# Patient Record
Sex: Male | Born: 1960 | Race: White | Hispanic: No | Marital: Married | Smoking: Former smoker
Health system: Southern US, Community
[De-identification: ages and names within clinical notes are randomized; demographics above are authoritative.]

## PROBLEM LIST (undated history)

## (undated) DIAGNOSIS — C801 Malignant (primary) neoplasm, unspecified: Secondary | ICD-10-CM

## (undated) DIAGNOSIS — I10 Essential (primary) hypertension: Secondary | ICD-10-CM

## (undated) DIAGNOSIS — I499 Cardiac arrhythmia, unspecified: Secondary | ICD-10-CM

## (undated) DIAGNOSIS — Z923 Personal history of irradiation: Secondary | ICD-10-CM

## (undated) HISTORY — DX: Personal history of irradiation: Z92.3

## (undated) HISTORY — DX: Essential (primary) hypertension: I10

---
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
## 2010-01-26 HISTORY — PX: SHOULDER SURGERY: SHX246

## 2011-05-30 DIAGNOSIS — C801 Malignant (primary) neoplasm, unspecified: Secondary | ICD-10-CM

## 2011-05-30 HISTORY — DX: Malignant (primary) neoplasm, unspecified: C80.1

## 2011-06-02 ENCOUNTER — Encounter (HOSPITAL_BASED_OUTPATIENT_CLINIC_OR_DEPARTMENT_OTHER): Payer: 59 | Admitting: Oncology

## 2011-06-02 ENCOUNTER — Ambulatory Visit (HOSPITAL_COMMUNITY)
Admission: RE | Admit: 2011-06-02 | Discharge: 2011-06-02 | Disposition: A | Discharge status: Home / Self Care | Payer: 59 | Source: Ambulatory Visit | Attending: Gastroenterology | Admitting: Gastroenterology

## 2011-06-02 DIAGNOSIS — C20 Malignant neoplasm of rectum: Secondary | ICD-10-CM

## 2011-06-08 ENCOUNTER — Ambulatory Visit
Admit: 2011-06-08 | Discharge: 2011-06-08 | Disposition: A | Discharge status: Home / Self Care | Payer: 59 | Attending: Radiation Oncology | Admitting: Radiation Oncology

## 2011-06-08 ENCOUNTER — Encounter (HOSPITAL_COMMUNITY): Payer: Self-pay

## 2011-06-08 ENCOUNTER — Encounter (HOSPITAL_BASED_OUTPATIENT_CLINIC_OR_DEPARTMENT_OTHER): Payer: 59 | Admitting: Oncology

## 2011-06-08 ENCOUNTER — Ambulatory Visit: Payer: 59 | Admitting: Radiation Oncology

## 2011-06-08 ENCOUNTER — Other Ambulatory Visit: Payer: Self-pay | Admitting: Oncology

## 2011-06-08 ENCOUNTER — Ambulatory Visit (HOSPITAL_COMMUNITY): Admit: 2011-06-08 | Payer: 59

## 2011-06-08 ENCOUNTER — Ambulatory Visit (HOSPITAL_COMMUNITY): Payer: 59

## 2011-06-08 ENCOUNTER — Ambulatory Visit (HOSPITAL_COMMUNITY)
Admission: RE | Admit: 2011-06-08 | Discharge: 2011-06-08 | Disposition: A | Discharge status: Home / Self Care | Payer: 59 | Source: Ambulatory Visit | Attending: Oncology | Admitting: Oncology

## 2011-06-08 DIAGNOSIS — C20 Malignant neoplasm of rectum: Secondary | ICD-10-CM

## 2011-06-08 DIAGNOSIS — R142 Eructation: Secondary | ICD-10-CM | POA: Insufficient documentation

## 2011-06-08 DIAGNOSIS — R5383 Other fatigue: Secondary | ICD-10-CM | POA: Insufficient documentation

## 2011-06-08 DIAGNOSIS — R143 Flatulence: Secondary | ICD-10-CM | POA: Insufficient documentation

## 2011-06-08 DIAGNOSIS — Z510 Encounter for antineoplastic radiation therapy: Secondary | ICD-10-CM | POA: Insufficient documentation

## 2011-06-08 DIAGNOSIS — K7689 Other specified diseases of liver: Secondary | ICD-10-CM | POA: Insufficient documentation

## 2011-06-08 DIAGNOSIS — R141 Gas pain: Secondary | ICD-10-CM | POA: Insufficient documentation

## 2011-06-08 DIAGNOSIS — R5381 Other malaise: Secondary | ICD-10-CM | POA: Insufficient documentation

## 2011-06-08 HISTORY — DX: Malignant (primary) neoplasm, unspecified: C80.1

## 2011-06-08 LAB — CMP (CANCER CENTER ONLY)
ALT(SGPT): 15 U/L (ref 10–47)
AST: 18 U/L (ref 11–38)
Albumin: 3.1 g/dL — ABNORMAL LOW (ref 3.3–5.5)
Alkaline Phosphatase: 56 U/L (ref 26–84)
BUN, Bld: 17 mg/dL (ref 7–22)
CO2: 29 mEq/L (ref 18–33)
Calcium: 8.9 mg/dL (ref 8.0–10.3)
Chloride: 100 mEq/L (ref 98–108)
Creat: 1.3 mg/dl — ABNORMAL HIGH (ref 0.6–1.2)
Glucose, Bld: 105 mg/dL (ref 73–118)
Potassium: 4.3 mEq/L (ref 3.3–4.7)
Sodium: 141 mEq/L (ref 128–145)
Total Bilirubin: 0.4 mg/dl (ref 0.20–1.60)
Total Protein: 6.4 g/dL (ref 6.4–8.1)

## 2011-06-08 LAB — CBC WITH DIFFERENTIAL/PLATELET
BASO%: 0.4 % (ref 0.0–2.0)
Basophils Absolute: 0 10*3/uL (ref 0.0–0.1)
EOS%: 3.2 % (ref 0.0–7.0)
Eosinophils Absolute: 0.3 10*3/uL (ref 0.0–0.5)
HCT: 42.8 % (ref 38.4–49.9)
HGB: 14.5 g/dL (ref 13.0–17.1)
LYMPH%: 34.7 % (ref 14.0–49.0)
MCH: 30.7 pg (ref 27.2–33.4)
MCHC: 33.9 g/dL (ref 32.0–36.0)
MCV: 90.5 fL (ref 79.3–98.0)
MONO#: 0.8 10*3/uL (ref 0.1–0.9)
MONO%: 9.4 % (ref 0.0–14.0)
NEUT#: 4.3 10*3/uL (ref 1.5–6.5)
NEUT%: 52.3 % (ref 39.0–75.0)
Platelets: 263 10*3/uL (ref 140–400)
RBC: 4.73 10*6/uL (ref 4.20–5.82)
RDW: 14.7 % — ABNORMAL HIGH (ref 11.0–14.6)
WBC: 8.1 10*3/uL (ref 4.0–10.3)
lymph#: 2.8 10*3/uL (ref 0.9–3.3)

## 2011-06-08 LAB — CEA: CEA: 1.3 ng/mL (ref 0.0–5.0)

## 2011-06-08 MED ORDER — IOHEXOL 300 MG/ML  SOLN
100.0000 mL | Freq: Once | INTRAMUSCULAR | Status: AC | PRN
  Administered 2011-06-08: 100 mL via INTRAVENOUS

## 2011-06-09 ENCOUNTER — Encounter: Payer: 59 | Admitting: Oncology

## 2011-06-14 ENCOUNTER — Encounter (HOSPITAL_BASED_OUTPATIENT_CLINIC_OR_DEPARTMENT_OTHER): Payer: 59 | Admitting: Oncology

## 2011-06-14 DIAGNOSIS — C20 Malignant neoplasm of rectum: Secondary | ICD-10-CM

## 2011-06-20 ENCOUNTER — Encounter (INDEPENDENT_AMBULATORY_CARE_PROVIDER_SITE_OTHER): Payer: Self-pay | Admitting: General Surgery

## 2011-06-20 ENCOUNTER — Ambulatory Visit (INDEPENDENT_AMBULATORY_CARE_PROVIDER_SITE_OTHER): Payer: 59 | Admitting: General Surgery

## 2011-06-20 ENCOUNTER — Other Ambulatory Visit (INDEPENDENT_AMBULATORY_CARE_PROVIDER_SITE_OTHER): Payer: Self-pay

## 2011-06-20 VITALS — BP 150/98 | HR 96 | Temp 98.0°F | Ht 71.0 in | Wt 179.0 lb

## 2011-06-20 DIAGNOSIS — C20 Malignant neoplasm of rectum: Secondary | ICD-10-CM | POA: Insufficient documentation

## 2011-06-20 NOTE — Progress Notes (Signed)
Casey [name] is a 50 y.o. male.    Chief Complaint  Patient presents with  . Other    HPI HPI Patient is a 50 year old male who had a screening colonoscopy and was found to have a baseball size mass in his rectum. About 80% mass was removed piecemeal with snare section. Pathology was positive for adenocarcinoma. He had other small polyps as well. There was a polyp at the ileocecal valve which was flat and 10 mm. Pathology is not immediately available. He did retrospectively noticed that his stools were smaller caliber and were more frequent before the colonoscopy. If this improves since. He is getting ready to start radiation and Xeloda treatment today. He has only occasional blood in the stool. He does have any nausea or vomiting. Has occasional discomfort in his suprapubic region. Denies fevers and chills or weight loss.  Past Medical History  Diagnosis Date  . Cancer     History reviewed. No pertinent past surgical history.  Family History  Problem Relation Age of Onset  . Heart disease Mother     heart attack  . Cancer Mother     skin  . Hypertension Mother   . Hyperlipidemia Mother     Social History History  Substance Use Topics  . Smoking status: Former Games developer  . Smokeless tobacco: Not on file  . Alcohol Use: Yes    No Known Allergies  Current Outpatient Prescriptions  Medication Sig Dispense Refill  . capecitabine (XELODA) 500 MG tablet Take 500 mg by mouth 2 (two) times daily after a meal.        . loratadine (CLARITIN) 10 MG tablet Take 10 mg by mouth daily.          Review of Systems Review of Systems  Constitutional: Positive for malaise/fatigue. Negative for fever, chills and weight loss.  HENT: Negative.   Eyes: Negative.   Respiratory: Negative.   Cardiovascular: Negative.   Gastrointestinal: Positive for blood in stool.  Genitourinary: Negative.   Musculoskeletal: Positive for joint pain.  Skin: Negative.   Neurological: Negative.     Endo/Heme/Allergies: Negative.   Psychiatric/Behavioral: Negative.     Physical Exam Physical Exam  Constitutional: He is oriented to person, place, and time. He appears well-developed and well-nourished. No distress.  HENT:  Head: Normocephalic and atraumatic.  Nose: Nose normal.  Mouth/Throat: Oropharynx is clear and moist. No oropharyngeal exudate.  Eyes: Conjunctivae are normal. Pupils are equal, round, and reactive to light. Right eye exhibits no discharge. Left eye exhibits no discharge. No scleral icterus.  Neck: Neck supple. No tracheal deviation present. No thyromegaly present.  Cardiovascular: Normal rate, regular rhythm, normal heart sounds and intact distal pulses.  Exam reveals no gallop and no friction rub.   No murmur heard. Respiratory: Effort normal and breath sounds normal. No respiratory distress. He has no wheezes. He has no rales. He exhibits no tenderness.  GI: Soft. Bowel sounds are normal. He exhibits no distension and no mass. There is no tenderness. There is no rebound and no guarding.  Musculoskeletal: Normal range of motion. He exhibits no edema and no tenderness.  Lymphadenopathy:    He has no cervical adenopathy.  Neurological: He is alert and oriented to person, place, and time. He has normal reflexes. Coordination normal.  Skin: Skin is warm and dry. No rash noted. He is not diaphoretic. No erythema. No pallor.  Psychiatric: He has a normal mood and affect. His behavior is normal. Judgment and thought content normal.  Blood pressure 150/98, pulse 96, temperature 98 F (36.7 C), height 5\' 11"  (1.803 m), weight 179 lb (81.194 kg).  Assessment/Plan Stage III rectal cancer  Getting neoadjuvant chemoradiation  Follow up with me at the end of radiation  Will need laparoscopic LAR and probable diverting ileostomy  Will need mass tattooed in case a complete pathologic response is achieved.    Eyonna Sandstrom 06/20/2011, 10:49 AM

## 2011-07-01 ENCOUNTER — Encounter (HOSPITAL_BASED_OUTPATIENT_CLINIC_OR_DEPARTMENT_OTHER): Payer: 59 | Admitting: Oncology

## 2011-07-01 ENCOUNTER — Other Ambulatory Visit: Payer: Self-pay | Admitting: Oncology

## 2011-07-01 DIAGNOSIS — C20 Malignant neoplasm of rectum: Secondary | ICD-10-CM

## 2011-07-01 LAB — CBC WITH DIFFERENTIAL/PLATELET
BASO%: 0.5 % (ref 0.0–2.0)
Basophils Absolute: 0 10*3/uL (ref 0.0–0.1)
EOS%: 1.2 % (ref 0.0–7.0)
Eosinophils Absolute: 0.1 10*3/uL (ref 0.0–0.5)
HCT: 42.4 % (ref 38.4–49.9)
HGB: 14.4 g/dL (ref 13.0–17.1)
LYMPH%: 20.8 % (ref 14.0–49.0)
MCH: 30.8 pg (ref 27.2–33.4)
MCHC: 33.9 g/dL (ref 32.0–36.0)
MCV: 90.8 fL (ref 79.3–98.0)
MONO#: 0.5 10*3/uL (ref 0.1–0.9)
MONO%: 11.2 % (ref 0.0–14.0)
NEUT#: 2.9 10*3/uL (ref 1.5–6.5)
NEUT%: 66.3 % (ref 39.0–75.0)
Platelets: 197 10*3/uL (ref 140–400)
RBC: 4.66 10*6/uL (ref 4.20–5.82)
RDW: 14.4 % (ref 11.0–14.6)
WBC: 4.4 10*3/uL (ref 4.0–10.3)
lymph#: 0.9 10*3/uL (ref 0.9–3.3)

## 2011-07-01 LAB — BASIC METABOLIC PANEL
BUN: 15 mg/dL (ref 6–23)
CO2: 26 mEq/L (ref 19–32)
Calcium: 9.2 mg/dL (ref 8.4–10.5)
Chloride: 103 mEq/L (ref 96–112)
Creatinine, Ser: 1.24 mg/dL (ref 0.50–1.35)
Glucose, Bld: 91 mg/dL (ref 70–99)
Potassium: 3.9 mEq/L (ref 3.5–5.3)
Sodium: 139 mEq/L (ref 135–145)

## 2011-07-15 ENCOUNTER — Encounter (HOSPITAL_BASED_OUTPATIENT_CLINIC_OR_DEPARTMENT_OTHER): Payer: 59 | Admitting: Oncology

## 2011-07-15 ENCOUNTER — Other Ambulatory Visit: Payer: Self-pay | Admitting: Oncology

## 2011-07-15 DIAGNOSIS — C20 Malignant neoplasm of rectum: Secondary | ICD-10-CM

## 2011-07-15 LAB — COMPREHENSIVE METABOLIC PANEL
ALT: 13 U/L (ref 0–53)
AST: 13 U/L (ref 0–37)
Albumin: 4.2 g/dL (ref 3.5–5.2)
Alkaline Phosphatase: 35 U/L — ABNORMAL LOW (ref 39–117)
BUN: 17 mg/dL (ref 6–23)
CO2: 25 mEq/L (ref 19–32)
Calcium: 9 mg/dL (ref 8.4–10.5)
Chloride: 103 mEq/L (ref 96–112)
Creatinine, Ser: 1.34 mg/dL (ref 0.50–1.35)
Glucose, Bld: 105 mg/dL — ABNORMAL HIGH (ref 70–99)
Potassium: 4 mEq/L (ref 3.5–5.3)
Sodium: 139 mEq/L (ref 135–145)
Total Bilirubin: 0.8 mg/dL (ref 0.3–1.2)
Total Protein: 6.4 g/dL (ref 6.0–8.3)

## 2011-07-15 LAB — CBC WITH DIFFERENTIAL/PLATELET
BASO%: 0.3 % (ref 0.0–2.0)
Basophils Absolute: 0 10*3/uL (ref 0.0–0.1)
EOS%: 6.8 % (ref 0.0–7.0)
Eosinophils Absolute: 0.3 10*3/uL (ref 0.0–0.5)
HCT: 41.4 % (ref 38.4–49.9)
HGB: 14.1 g/dL (ref 13.0–17.1)
LYMPH%: 13.2 % — ABNORMAL LOW (ref 14.0–49.0)
MCH: 31.4 pg (ref 27.2–33.4)
MCHC: 34.2 g/dL (ref 32.0–36.0)
MCV: 91.9 fL (ref 79.3–98.0)
MONO#: 0.6 10*3/uL (ref 0.1–0.9)
MONO%: 12.5 % (ref 0.0–14.0)
NEUT#: 3.4 10*3/uL (ref 1.5–6.5)
NEUT%: 67.2 % (ref 39.0–75.0)
Platelets: 204 10*3/uL (ref 140–400)
RBC: 4.5 10*6/uL (ref 4.20–5.82)
RDW: 15 % — ABNORMAL HIGH (ref 11.0–14.6)
WBC: 5.1 10*3/uL (ref 4.0–10.3)
lymph#: 0.7 10*3/uL — ABNORMAL LOW (ref 0.9–3.3)

## 2011-07-29 ENCOUNTER — Encounter (INDEPENDENT_AMBULATORY_CARE_PROVIDER_SITE_OTHER): Payer: Self-pay | Admitting: Surgery

## 2011-07-29 ENCOUNTER — Ambulatory Visit (INDEPENDENT_AMBULATORY_CARE_PROVIDER_SITE_OTHER): Payer: 59 | Admitting: Surgery

## 2011-07-29 VITALS — BP 128/82 | HR 84 | Temp 99.0°F | Ht 71.0 in | Wt 181.0 lb

## 2011-07-29 DIAGNOSIS — C20 Malignant neoplasm of rectum: Secondary | ICD-10-CM

## 2011-07-29 NOTE — Patient Instructions (Signed)
Will schedule surgery in about 4 weeks.  Do bowel prep the day before surgery.    You have literature on colon/rectal cancer.

## 2011-07-29 NOTE — Progress Notes (Addendum)
ASSESSMENT AND PLAN: 1.  Rectal Cancer.   12 cm from anal verge.  Stage III by ractal ultrasound.  CEA - 1.3  Completed neoadjuvant chemo and radiation therapy 07/27/2011.  Chemotx supervised by Dr. Leonard Schwartz. [name].    Radiation therapy supervised by Dr. Augustine Radar.  GI doctor - Dr. Shela Commons. [name].   He has requested that I do his surgery.  I discussed with the patient the indications and risks of colon/rectal surgery.  The primary risks of colon/rectal surgery include, but are not limited to, bleeding, infection, leak from the bowel, and inadequate surgical margins.  I talked about a protective ostomy, which  depend on the level of the cancer, the difficulty of the surgery, and the changes from the radiation.  I also talked about a resection of the entire rectum, which with the information I have, is unlikely.  I tried to answer the patient's questions.  I gave the patient literature about colon/rectal surgery.  I gave the patient a mechanical and antibiotic bowel prep.  I will use Entereg perioperatively.  We will schedule his surgery about one month after completing his radiation therapy ... end of September/beginning of October, 2012.  2.  Quit smoking in July 2012. [3.  Atypical chest pain.  He saw Dr. Shela Commons. [name] 08/26/11. [name] is planning a POET.]  [Stress test by [name] 08/30/2011 was neg.   DN 08/30/2011]  HPI:  Casey [name] is a 50 y.o. (DOB: May 22, 1961)  white male who is a patient of MEYERS,STEPHEN C, MD and comes to me today for a second opinion about his  rectal cancer.  Has seen Dr. Stephens Shire from a GI standpoint.  [name] tattooed the tumor area 07/16/2011.  This all began in the mid summer 2012 when he decided to get a colonoscopy.  He said that he got one because of his age, not because of any particular symptom.  He had had some bleeding, but he attributed this to hemorrhoids that he said bled in the spring and the fall.  He had no prior GI history and family history of  colon cancer.  He had no prior history of stomach disease, liver disease, pancreatic disease, or abdominal problems.   At colonoscopy, [name] found a polypoid tumor at 12 cm from the anal verge.  His pathology showed an adenocarcinoma.  [name] was able to loop off some of the tumor.  Endorectal ultrasound by Dr. Dulce Sellar revealed a T3, possible N1 tumor.  He had a CT scan of abdomen/pelvis on 06/08/2011 that showed a polypoid lesion in his rectum.  There was also a single tiny (2-3 mm) lesion in the dome of his liver.  He then saw Drs. [name] and [place].  He saw Dr. Donell Beers on 06/20/11, but for some reason wanted a second surgical opinion.  He has completed neoadjuvant therapy with xeloda and radiation tx this past Wednesday, July 27, 2011.  He did well with the treatment except for some dry mouth.  He also had some pressure symptoms unrelated to exercise.  He said he missed most of the major side effects.  He is fairly talkative and had a lot of questions.  Current Outpatient Prescriptions on File Prior to Visit  Medication Sig Dispense Refill  . loratadine (CLARITIN) 10 MG tablet Take 10 mg by mouth daily.        . capecitabine (XELODA) 500 MG tablet Take 500 mg by mouth 2 (two) times daily after a meal.  Last dose 07-27-11       Review of Systems: Skin:  No history of rash.  No history of abnormal moles. Infection:  No history of hepatitis or HIV.  No history of MRSA. Neurologic:  No history of stroke.  No history of seizure.  No history of headaches. Cardiac:  No history of hypertension. No history of heart disease.  No history of prior cardiac catheterization.  [Seeing Dr. Daiva Nakayama for atypical chest pain.  DN 08/26/11.] Pulmonary:  Quit smoking with diagnosis of colon cancer..  Endocrine:  No diabetes. No thyroid disease. Gastrointestinal:  See HPI. Urologic:  No history of kidney stones.  No history of bladder infections. Musculoskeletal:  Left shoulder surgery in 2011 by Dr.  Cleophas Dunker. Hematologic:  No bleeding disorder.  No history of anemia.  Not anticoagulated.  SOCIAL HISTORY: Accompanied by wife. Works at [employer].  PHYSICAL EXAM: BP 128/82  Pulse 84  Temp(Src) 99 F (37.2 C) (Temporal)  Ht 5\' 11"  (1.803 m)  Wt 181 lb (82.101 kg)  BMI 25.24 kg/m2  General:  Fully bearded, talkative healthy male. HEENT: Normal. Pupils equal. Normal dentition. Neck: Supple. No thyroid mass. Lymph Nodes:  No supraclavicular or cervical nodes.  No inguinal nodes. Lungs: Clear and symmetric. Heart:  RRR. No murmur.  Abdomen:  Radiation changes around suprapubic area.  No mass.  No hernia. Normal bowel sounds.  No abdominal scars. Rectal: Radiation changes around perineum.  Rectal exam limited by pain, but I could not feel the tumor. Extremities:  Good strength in upper and lower extremities. Neurologic:  Grossly intact to motor and sensory function.   DATA REVIEWED: Dr. Arita Miss note, CT scan - 06/08/11 - Dr. Quenten Raven, Pathology. I discussed patient with Drs. [name] and [place].

## 2011-08-09 ENCOUNTER — Encounter (HOSPITAL_BASED_OUTPATIENT_CLINIC_OR_DEPARTMENT_OTHER): Payer: 59 | Admitting: Oncology

## 2011-08-09 DIAGNOSIS — R0789 Other chest pain: Secondary | ICD-10-CM

## 2011-08-09 DIAGNOSIS — C20 Malignant neoplasm of rectum: Secondary | ICD-10-CM

## 2011-08-16 ENCOUNTER — Encounter (INDEPENDENT_AMBULATORY_CARE_PROVIDER_SITE_OTHER): Payer: 59 | Admitting: General Surgery

## 2011-08-17 ENCOUNTER — Ambulatory Visit: Payer: 59 | Attending: Radiation Oncology | Admitting: Radiation Oncology

## 2011-08-19 ENCOUNTER — Ambulatory Visit
Admission: RE | Admit: 2011-08-19 | Discharge: 2011-08-19 | Disposition: A | Discharge status: Home / Self Care | Payer: 59 | Source: Ambulatory Visit | Attending: Radiation Oncology | Admitting: Radiation Oncology

## 2011-08-24 ENCOUNTER — Encounter: Payer: Self-pay | Admitting: Cardiology

## 2011-08-26 ENCOUNTER — Ambulatory Visit (INDEPENDENT_AMBULATORY_CARE_PROVIDER_SITE_OTHER): Payer: 59 | Admitting: Cardiology

## 2011-08-26 ENCOUNTER — Encounter: Payer: Self-pay | Admitting: Cardiology

## 2011-08-26 VITALS — BP 142/86 | HR 72 | Ht 71.0 in | Wt 187.0 lb

## 2011-08-26 DIAGNOSIS — R079 Chest pain, unspecified: Secondary | ICD-10-CM | POA: Insufficient documentation

## 2011-08-26 DIAGNOSIS — R072 Precordial pain: Secondary | ICD-10-CM

## 2011-08-26 DIAGNOSIS — I10 Essential (primary) hypertension: Secondary | ICD-10-CM

## 2011-08-26 NOTE — Patient Instructions (Signed)
Your physician has requested that you have an exercise tolerance test. For further information please visit [email or web address] Please also follow instruction sheet, as given.  Please continue medications as listed

## 2011-08-26 NOTE — Assessment & Plan Note (Signed)
His chest pain is somewhat atypical.  However, he has a long smoking history.  I will bring the patient back for a POET (Plain Old Exercise Test). This will allow me to screen for obstructive coronary disease.  I will arrange to have this done early next week prior to the planned surgery.

## 2011-08-26 NOTE — Progress Notes (Signed)
The patient presents for evaluation of chest discomfort. He has no prior cardiac history. However, he has been having chest discomfort for the last 2 weeks. He was being treated with Xeloda and radiation for rectal cancer.  He is due to have surgery this week.  He reports that at some point after starting his chemotherapy he noticed discomfort in his chest. He had first associated this with taking the pills as it occurred shortly thereafter. However, eventually he started noticing this at other times of the day. He would feel a pushing in his chest area and he would then feel weak in his arms and throughout his body. The discomfort might last from seconds to hours. It might be as intense as 7/10. He did not describe associated symptoms such as nausea vomiting or diaphoresis. He did not report palpitations, presyncope or syncope. He would not get particularly short of breath and is not describing PND or orthopnea. He has had no cough fevers or chills. He has had no weight gain or edema. He has been doing some light work and cannot bring on the symptoms.  No Known Allergies  Current Outpatient Prescriptions  Medication Sig Dispense Refill  . loratadine (CLARITIN) 10 MG tablet Take 10 mg by mouth daily.          Past Medical History  Diagnosis Date  . Cancer     Past Surgical History  Procedure Date  . Eye surgery 1963  . Shoulder surgery 03/ 2011    Family History  Problem Relation Age of Onset  . Heart disease Mother     heart attack  . Cancer Mother     skin  . Hypertension Mother   . Hyperlipidemia Mother     History   Social History  . Marital Status: Single    Spouse Name: N/A    Number of Children: N/A  . Years of Education: N/A   Occupational History  . Not on file.   Social History Main Topics  . Smoking status: Former Smoker    Quit date: 06/11/2011  . Smokeless tobacco: Not on file  . Alcohol Use: Yes  . Drug Use: No  . Sexually Active: Not on file   Other  Topics Concern  . Not on file   Social History Narrative  . No narrative on file    ROS:  As stated in the HPI and negative for all other systems.   PHYSICAL EXAM BP 142/86  Pulse 72  Ht 5\' 11"  (1.803 m)  Wt 187 lb (84.823 kg)  BMI 26.08 kg/m2 GENERAL:  Well appearing HEENT:  Pupils equal round and reactive, fundi not visualized, oral mucosa unremarkable NECK:  No jugular venous distention, waveform within normal limits, carotid upstroke brisk and symmetric, no bruits, no thyromegaly LYMPHATICS:  No cervical, inguinal adenopathy LUNGS:  Clear to auscultation bilaterally BACK:  No CVA tenderness CHEST:  Unremarkable HEART:  PMI not displaced or sustained,S1 and S2 within normal limits, no S3, no S4, no clicks, no rubs, no murmurs ABD:  Flat, positive bowel sounds normal in frequency in pitch, no bruits, no rebound, no guarding, no midline pulsatile mass, no hepatomegaly, no splenomegaly EXT:  2 plus pulses throughout, no edema, no cyanosis no clubbing SKIN:  No rashes no nodules NEURO:  Cranial nerves II through XII grossly intact, motor grossly intact throughout PSYCH:  Cognitively intact, oriented to person place and time   EKG:  Sinus rhythm, rate 72, axis within normal limits, intervals within  normal limits, no acute ST-T wave changes.   ASSESSMENT AND PLAN

## 2011-08-26 NOTE — Assessment & Plan Note (Signed)
His blood pressure is slightly elevated.  However, this is not the usual.  No change in therapy is indicated.

## 2011-08-29 ENCOUNTER — Other Ambulatory Visit (INDEPENDENT_AMBULATORY_CARE_PROVIDER_SITE_OTHER): Payer: Self-pay | Admitting: Surgery

## 2011-08-29 ENCOUNTER — Encounter (HOSPITAL_COMMUNITY): Payer: 59

## 2011-08-29 LAB — CBC
HCT: 44.7 % (ref 39.0–52.0)
Hemoglobin: 15.1 g/dL (ref 13.0–17.0)
MCH: 32.3 pg (ref 26.0–34.0)
MCHC: 33.8 g/dL (ref 30.0–36.0)
MCV: 95.5 fL (ref 78.0–100.0)
Platelets: 307 10*3/uL (ref 150–400)
RBC: 4.68 MIL/uL (ref 4.22–5.81)
RDW: 18.3 % — ABNORMAL HIGH (ref 11.5–15.5)
WBC: 5.7 10*3/uL (ref 4.0–10.5)

## 2011-08-29 LAB — SURGICAL PCR SCREEN
MRSA, PCR: NEGATIVE
Staphylococcus aureus: NEGATIVE

## 2011-08-29 LAB — DIFFERENTIAL
Basophils Absolute: 0 10*3/uL (ref 0.0–0.1)
Basophils Relative: 0 % (ref 0–1)
Eosinophils Absolute: 0.3 10*3/uL (ref 0.0–0.7)
Eosinophils Relative: 6 % — ABNORMAL HIGH (ref 0–5)
Lymphocytes Relative: 14 % (ref 12–46)
Lymphs Abs: 0.8 10*3/uL (ref 0.7–4.0)
Monocytes Absolute: 0.6 10*3/uL (ref 0.1–1.0)
Monocytes Relative: 11 % (ref 3–12)
Neutro Abs: 4 10*3/uL (ref 1.7–7.7)
Neutrophils Relative %: 70 % (ref 43–77)

## 2011-08-29 LAB — COMPREHENSIVE METABOLIC PANEL
ALT: 36 U/L (ref 0–53)
AST: 27 U/L (ref 0–37)
Albumin: 3.9 g/dL (ref 3.5–5.2)
Alkaline Phosphatase: 54 U/L (ref 39–117)
BUN: 20 mg/dL (ref 6–23)
CO2: 27 mEq/L (ref 19–32)
Calcium: 9.8 mg/dL (ref 8.4–10.5)
Chloride: 101 mEq/L (ref 96–112)
Creatinine, Ser: 1.08 mg/dL (ref 0.50–1.35)
GFR calc Af Amer: 89 mL/min — ABNORMAL LOW (ref >90)
GFR calc non Af Amer: 77 mL/min — ABNORMAL LOW (ref >90)
Glucose, Bld: 98 mg/dL (ref 70–99)
Potassium: 4.4 mEq/L (ref 3.5–5.1)
Sodium: 138 mEq/L (ref 135–145)
Total Bilirubin: 0.4 mg/dL (ref 0.3–1.2)
Total Protein: 7.4 g/dL (ref 6.0–8.3)

## 2011-08-29 LAB — ABO/RH: ABO/RH(D): O POS

## 2011-08-29 NOTE — Progress Notes (Signed)
Quick Note:  These labs are OK for surgery. ______ 

## 2011-08-30 ENCOUNTER — Ambulatory Visit (INDEPENDENT_AMBULATORY_CARE_PROVIDER_SITE_OTHER): Payer: 59 | Admitting: Cardiology

## 2011-08-30 DIAGNOSIS — R072 Precordial pain: Secondary | ICD-10-CM

## 2011-08-30 NOTE — Progress Notes (Signed)
Exercise Treadmill Test  Pre-Exercise Testing Evaluation Rhythm: normal sinus  Rate: 87   PR:  .14 QRS:  .09  QT:  .36 QTc: .43     Test  Exercise Tolerance Test Ordering MD: Angelina Sheriff, MD  Interpreting MD:  Angelina Sheriff, MD  Unique Test No: 1  Treadmill:  1  Indication for ETT: chest pain - rule out ischemia  Contraindication to ETT: No   Stress Modality: exercise - treadmill  Cardiac Imaging Performed: non   Protocol: standard Bruce - maximal  Max BP:  208/91  Max MPHR (bpm):  168 85% MPR (bpm):  142  MPHR obtained (bpm):  155 % MPHR obtained:  91  Reached 85% MPHR (min:sec):  5:30 Total Exercise Time (min-sec):  6:30  Workload in METS: 7.7 Borg Scale: 16  Reason ETT Terminated:  desired heart rate attained    ST Segment Analysis At Rest: normal ST segments - no evidence of significant ST depression With Exercise: no evidence of significant ST depression  Other Information Arrhythmia:  Yes Angina during ETT:  absent (0) Quality of ETT:  diagnostic  ETT Interpretation:  normal - no evidence of ischemia by ST analysis  Comments: The patient had an excellent exercise tolerance.  There was no chest pain.  There was an appropriate level of dyspnea.  There a normal heart rate response.  He did have an increased BP response.  There were no ischemic ST T wave changes and a normal heart rate recovery.  He did have rare PVCs and couplets  Recommendations: Negative adequate ETT.  No further testing is indicated.  Based on the above I gave the patient a prescription for exercise.  The patient is at acceptable risk for the planned surgery.

## 2011-09-01 ENCOUNTER — Inpatient Hospital Stay (HOSPITAL_COMMUNITY)
Admission: RE | Admit: 2011-09-01 | Discharge: 2011-09-07 | DRG: 331 | Disposition: A | Discharge status: Home Health | Payer: 59 | Source: Ambulatory Visit | Attending: Surgery | Admitting: Surgery

## 2011-09-01 ENCOUNTER — Other Ambulatory Visit (INDEPENDENT_AMBULATORY_CARE_PROVIDER_SITE_OTHER): Payer: Self-pay | Admitting: Surgery

## 2011-09-01 DIAGNOSIS — K7689 Other specified diseases of liver: Secondary | ICD-10-CM | POA: Diagnosis present

## 2011-09-01 DIAGNOSIS — Z9221 Personal history of antineoplastic chemotherapy: Secondary | ICD-10-CM

## 2011-09-01 DIAGNOSIS — Q859 Phakomatosis, unspecified: Secondary | ICD-10-CM

## 2011-09-01 DIAGNOSIS — C20 Malignant neoplasm of rectum: Secondary | ICD-10-CM

## 2011-09-01 DIAGNOSIS — Z01812 Encounter for preprocedural laboratory examination: Secondary | ICD-10-CM

## 2011-09-01 DIAGNOSIS — Z832 Family history of diseases of the blood and blood-forming organs and certain disorders involving the immune mechanism: Secondary | ICD-10-CM

## 2011-09-01 DIAGNOSIS — Z923 Personal history of irradiation: Secondary | ICD-10-CM

## 2011-09-01 HISTORY — PX: APPENDECTOMY: SHX54

## 2011-09-01 HISTORY — PX: LOW ANTERIOR BOWEL RESECTION: SUR1240

## 2011-09-01 HISTORY — PX: ILEOSTOMY: SHX1783

## 2011-09-01 LAB — TYPE AND SCREEN
ABO/RH(D): O POS
Antibody Screen: NEGATIVE

## 2011-09-02 LAB — DIFFERENTIAL
Basophils Absolute: 0 10*3/uL (ref 0.0–0.1)
Basophils Relative: 0 % (ref 0–1)
Eosinophils Absolute: 0 10*3/uL (ref 0.0–0.7)
Eosinophils Relative: 0 % (ref 0–5)
Lymphocytes Relative: 2 % — ABNORMAL LOW (ref 12–46)
Lymphs Abs: 0.3 10*3/uL — ABNORMAL LOW (ref 0.7–4.0)
Monocytes Absolute: 0.9 10*3/uL (ref 0.1–1.0)
Monocytes Relative: 7 % (ref 3–12)
Neutro Abs: 11.3 10*3/uL — ABNORMAL HIGH (ref 1.7–7.7)
Neutrophils Relative %: 91 % — ABNORMAL HIGH (ref 43–77)

## 2011-09-02 LAB — BASIC METABOLIC PANEL
BUN: 12 mg/dL (ref 6–23)
CO2: 28 mEq/L (ref 19–32)
Calcium: 8 mg/dL — ABNORMAL LOW (ref 8.4–10.5)
Chloride: 105 mEq/L (ref 96–112)
Creatinine, Ser: 0.97 mg/dL (ref 0.50–1.35)
GFR calc Af Amer: >90 mL/min (ref >90)
GFR calc non Af Amer: >90 mL/min (ref >90)
Glucose, Bld: 164 mg/dL — ABNORMAL HIGH (ref 70–99)
Potassium: 4.2 mEq/L (ref 3.5–5.1)
Sodium: 137 mEq/L (ref 135–145)

## 2011-09-02 LAB — CBC
HCT: 31.3 % — ABNORMAL LOW (ref 39.0–52.0)
Hemoglobin: 10.5 g/dL — ABNORMAL LOW (ref 13.0–17.0)
MCH: 32.3 pg (ref 26.0–34.0)
MCHC: 33.5 g/dL (ref 30.0–36.0)
MCV: 96.3 fL (ref 78.0–100.0)
Platelets: 234 10*3/uL (ref 150–400)
RBC: 3.25 MIL/uL — ABNORMAL LOW (ref 4.22–5.81)
RDW: 18.3 % — ABNORMAL HIGH (ref 11.5–15.5)
WBC: 12.5 10*3/uL — ABNORMAL HIGH (ref 4.0–10.5)

## 2011-09-03 LAB — BASIC METABOLIC PANEL
BUN: 10 mg/dL (ref 6–23)
CO2: 29 mEq/L (ref 19–32)
Calcium: 8.4 mg/dL (ref 8.4–10.5)
Chloride: 103 mEq/L (ref 96–112)
Creatinine, Ser: 0.89 mg/dL (ref 0.50–1.35)
GFR calc Af Amer: >90 mL/min (ref >90)
GFR calc non Af Amer: >90 mL/min (ref >90)
Glucose, Bld: 118 mg/dL — ABNORMAL HIGH (ref 70–99)
Potassium: 3.6 mEq/L (ref 3.5–5.1)
Sodium: 136 mEq/L (ref 135–145)

## 2011-09-03 LAB — CBC
HCT: 32.1 % — ABNORMAL LOW (ref 39.0–52.0)
Hemoglobin: 10.5 g/dL — ABNORMAL LOW (ref 13.0–17.0)
MCH: 32.5 pg (ref 26.0–34.0)
MCHC: 32.7 g/dL (ref 30.0–36.0)
MCV: 99.4 fL (ref 78.0–100.0)
Platelets: 234 10*3/uL (ref 150–400)
RBC: 3.23 MIL/uL — ABNORMAL LOW (ref 4.22–5.81)
RDW: 18.5 % — ABNORMAL HIGH (ref 11.5–15.5)
WBC: 10.6 10*3/uL — ABNORMAL HIGH (ref 4.0–10.5)

## 2011-09-03 LAB — DIFFERENTIAL
Basophils Absolute: 0 10*3/uL (ref 0.0–0.1)
Basophils Relative: 0 % (ref 0–1)
Eosinophils Absolute: 0 10*3/uL (ref 0.0–0.7)
Eosinophils Relative: 0 % (ref 0–5)
Lymphocytes Relative: 9 % — ABNORMAL LOW (ref 12–46)
Lymphs Abs: 1 10*3/uL (ref 0.7–4.0)
Monocytes Absolute: 0.9 10*3/uL (ref 0.1–1.0)
Monocytes Relative: 8 % (ref 3–12)
Neutro Abs: 8.8 10*3/uL — ABNORMAL HIGH (ref 1.7–7.7)
Neutrophils Relative %: 83 % — ABNORMAL HIGH (ref 43–77)

## 2011-09-05 NOTE — Op Note (Signed)
NAMEDOLAN, Casey NO.:  [name]  MEDICAL RECORD NO.:  [id number]  LOCATION:  1523                         FACILITY:  [facility]  PHYSICIAN:  Sandria Bales. Ezzard Standing, M.D.  DATE OF BIRTH:  Oct 07, 1961  DATE OF PROCEDURE:  09/01/2011                              OPERATIVE REPORT   PREOPERATIVE DIAGNOSES:  Rectal cancer approximately 12 cm from anal verge.  POSTOPERATIVE DIAGNOSES:  Rectal cancer approximately 12 cm from anal verge, benign nodule, left lobe of liver.  PROCEDURE:  Laparoscopic-assisted low anterior resection (29 EEA anastomosis), mobilization of splenic flexure, biopsy of dome of left lobe of liver, appendectomy, diverting loop ileostomy, rigid sigmoidoscopy.  SURGEON:  Ovidio Kin, MD  FIRST ASSISTANT:  Clovis Pu. Cornett, M.D.  ANESTHESIA:  General endotracheal with 20 mL of Exparel.  COMPLICATIONS:  None.  INDICATIONS FOR PROCEDURE:  The patient is a 50 year old white male, patient of [name] who was found by [name] to have a rectal cancer.  Staging, this was thought to be a T3, possible N1 tumor. He underwent chemotherapy supervised [name] and radiation therapy supervised by [name] which he completed on July 27, 2011.  He had had some vague chest pains during treatment, was evaluated by [name] and cleared by stress test.  The patient now comes for surgical resection of this rectal tumor.  I discussed with him the indications and potential complications of rectal surgery.  Potential complications include, but not limited to, bleeding, infection, leak from the bowel, recurrence of tumor.  Ialso discussed that because of his prior chemo therapy and radiation to his pelvis, that I would probably use a protective ostomy, but that this would be temporary.  OPERATIVE NOTE:  The patient was placed in lithotomy position under general endotracheal anesthesia in room #11.  He had had a bowel prep ahead of  time.  He had been on Entereg.  He was given antibiotics, cefoxitin, at initiation of procedure.    A time-out was held and a surgical checklist run.  I first did a rigid sigmoidoscopy.  This tumor looked to be directly posterior.  There was approximately a 2-cm ulcerated area that was approximately 10 cm from anal verge on rigid sigmoidoscopy.  I then prepped the abdomen with ChloraPrep, the perineum with Betadine and sterilely draped.  I started laparoscopy by putting a 5-mm trocar in the right upper quadrant.  I placed four additional 5 mm trocars, one above the umbilicus in midline, one below the umbilicus in midline, one in the right lower quadrant and one in the left lower quadrant.  Abdominal exploration carried out.  The stomach, bowel that I could see, and peritoneum looked normal.    I looked at the liver.  On the preop CT scan, there was a suggestion of a nodule on the dome of the right lobe of the liver.  He had 3 or 4 small benign-appearing nodules on the dome of both lobes.  Actually, one looked suspicious on the dome of the left lobe of the liver.  I did a biopsy of this and sent it to pathology for frozen section.  [name] called back and  said this was a benign nodule.  His gallbladder appeared unremarkable.  His stomach appeared unremarkable.  The bowel that I could see was otherwise unremarkable. He did have inflammatory changes along with the left pelvic brim with some omentum stuck around this area.  When I got to my resection, I could feel a mass adjacent to his midsigmoid colon which seemed remote from where the tumor was.  Since [name] had done a colonoscopy, I doubted this was a second tumore and I think there may have been some residual diverticular changes in the bowel wall.  I included this change/mass with our excised specimen.  I proceed with our laparoscopic mobilization of the left colon, taking his left colon down to the splenic flexure  and taking the left transverse colon down until I got about 4 or 5 inches of mobility of the splenic flexure and left colon down towards the pelvis.  I then dissected off the inflammatory area along the left pelvic brim.  Omentum had been stuck in this area, the colon was stuck to the sidewall consistent with a prior inflammatory changes.  I think it was a little high for radiation changes from his preop radiation therapy.  I found the left and right ureters laparoscopically and made sure these were the lateral borders of our dissection and started a mesorectal excision of his rectum laparoscopically.  I explored planes on both sides down where I was probably about 4 cm before the peritoneal reflection.  Where exposure laparoscopically had been difficult, I converted and made a low midline incision for the open part of the operation.  I continued my dissection down, going posteriorly.  First, I found this mass sort of in his midsigmoid colon.  I divided the sigmoid colon proximal to this about 3 or 4 cm.  I took the mesentery down to the pelvic brim.  I was able to get into the sacral area posteriorly.  I was able to dissect this down posteriorly to the tip of the coccyx.  I got to the lateral pedicles on both the right and left sides and found the seminal vesicles anteriorly.  I went directly posterior to that to get the rectum off.  At this point, I repeated the sigmoidoscopy to document the level of our dissection. I manned the sigmoidoscope and [name] was able to look at the rectum to mark the level of the tumore.  I tried to get 2 cm below the tumor for our division.  I used a blue load of the [employer] Contour stapler placed around the rectum, again trying to get at least a 2-cm rim.  I thought I had a good mesorectal dissection of fat around the colon and I fired the contour stapler.  I placed a suture on the proximal end of the colon for the proximal sigmoid colon.  I  opened the specimen at a table.  I confirmed I had about 2 cm distal from the edge of tumor for my distal margin.  This was sent for permanent pathology.  I had set up the proximal sigmoid colon. I defatted the proximal end a little bit. I removed the staple line, used 29 EEA stapler from [employer] as the anvil and sewed this in place with running a 2-0 Prolene suture.  [name] did a sigmoidoscopy below, there were some bubbles coming in the middle of the staple line of the Contour.  This was a tiny hole, probably no more than 1 mm.  I was able to put a single 3-0 silk suture in this hole and the bubbling stopped.  I began just right in the middle of the staple line, which would be my target for trying to also resect this segment with the specimen.  He then used a 29 EEA stapler from below, passed the trocar immediately anterior to the staple line, hooked it up to the sigmoid colon.  He closed it and fired it.  We got two good rings, both the proximal and distal rings; both were sent for pathologic evaluation.  Also, the distal ring had the suture that I had used to close the hole.  I then clamped the sigmoid colon with a bowel clamp.  He repeated the sigmoidoscopy, he saw the anastomosis at about 8-10 cm, it was widely patent.  There was no air leak and it was felt to be a secure anastomosis.   I had two complete "rings" from the EEA.  The distal ring contained the suture I had used to close the small leak.  Because of his radiation to his pelvis, I felt he would be best served by having a diverting ileostomy that would be temporary until everything had healed.  I  took his appendix out.  It was retrocecal in sort of a curved fashion.  I took the mesentery down with a harmonic scalpel.  I ligated the base of the appendix with a 2-0 Vicryl suture.  I then did a 2-0 silk pursestring to invert the stop.    I then went back about 10 inches proximal to the ileocecal valve.  I pulled  the small bowel through an incision in the right lower quadrant.  I used a red rubber tube as a bridge underneath the small bowel and exteriorized this.  I went to mature this after I closed the abdomen.  I then irrigated the abdomen with 2 liters of saline.  I reinspected the pelvis.  There was no active bleeding.  I closed the peritoneum with a running 2-0 Vicryl suture.  I closed the fascia with a running #1 PDS suture, tied in the middle.  I irrigated the wound.  I used Exparel as a local anesthetic.  I mixed 20 mL with 20 mL of saline for a total of 40 mL, but used all 40 mL primary on the midline incision with a little bit around the ileostomy.  I then stapled the incision closed.  I did leave one trocar in that I could re- insufflate the abdomen, looked at the peritoneal cavity.  I looked at my incision from underneath.  There was no evidence of any bowel trapped. There was no evidence of any active bleeding either around the splenic flexure or left colon, and portion of small bowel was put up the anterior abdominal wall for the ileostomy.  I then removed that trocar. I stapled the trocar wounds.  I matured the ileostomy with interrupted 3-0 Vicryl sutures.  I used a #24 red rubber catheter bridge under the loop ileostomy and secured it with a 2-0 nylon suture.  I placed Telfa wicks in the midline incision.  All the wounds were dressed.  The colostomy bag placed around the ileostomy.  I did leave an NG tube in the patient, will leave it in overnight.  I had left a Foley in that we will leave in for least 1-2 days.  He tolerated the procedure well, was transported to recovery room in good condition.  Sponge and needle counts  were correct at the end of the case.    Sandria Bales. Ezzard Standing, M.D., FACS  DHN/MEDQ  D:  09/02/2011  T:  09/02/2011  Job:  [id number]  cc:   Joycelyn Rua, M.D. Fax: [phone]  Ladene Artist, M.D. Fax: [phone]  Radene Gunning, M.D., Ph.D. Fax:  [phone]  Griffith Citron, M.D. Fax: [phone]  Rollene Rotunda, MD, [facility] [address]  [address] [place] [address]  Electronically Signed by Ovidio Kin M.D. on 09/05/2011 10:02:54 AM

## 2011-09-13 ENCOUNTER — Encounter (INDEPENDENT_AMBULATORY_CARE_PROVIDER_SITE_OTHER): Payer: Self-pay

## 2011-09-16 ENCOUNTER — Ambulatory Visit (INDEPENDENT_AMBULATORY_CARE_PROVIDER_SITE_OTHER): Payer: 59 | Admitting: Surgery

## 2011-09-16 VITALS — BP 138/82 | HR 84 | Temp 97.9°F | Resp 18 | Ht 71.0 in | Wt 180.5 lb

## 2011-09-16 DIAGNOSIS — C20 Malignant neoplasm of rectum: Secondary | ICD-10-CM

## 2011-09-16 NOTE — Progress Notes (Signed)
ASSESSMENT AND PLAN: 1. Rectal Cancer.   12 cm from anal verge.   Final path - invasive adenocarcinoma,  pyT1, N0, M0  (Surgery 09/01/2011)   Tumor regression - Grade 1 of III   (Stage III by ractal ultrasound, preoperative)   CEA - 1.3   Completed neoadjuvant chemo and radiation therapy 07/27/2011. Chemotx supervised by Dr. Leonard Schwartz. [name]. Radiation therapy supervised by Dr. Augustine Radar.   GI doctor - Dr. Shela Commons. [name].   2.  Protective loop ileostomy,  I'll see him back in 6 weeks for discussion of reversal of the ileostomy.  ? Sigmoidoscope/BE first ?   3. Quit smoking in July 2012.   4. Atypical chest pain. Negative stress test.  He saw Dr. Shela Commons. [name] 08/26/11.  HISTORY OF PRESENT ILLNESS: Chief Complaint  Patient presents with  . Follow-up    Postop    Casey [name] is a 50 y.o. (DOB: [birth year])  white male who is a patient of MEYERS,STEPHEN C, MD and comes to me today for follow up of rectal resection.  Casey [name] had a LAR on 09/01/2011 for a rectal cancer at 10-12 cm from anal verge.  He has a protective ileostomy.  He has done well, though still has some tenderness.  He comes accompanied with his wife.  PHYSICAL EXAM: BP 138/82  Pulse 84  Temp(Src) 97.9 F (36.6 C) (Temporal)  Resp 18  Ht 5\' 11"  (1.803 m)  Wt 180 lb 8 oz (81.874 kg)  BMI 25.17 kg/m2  General: WNWM who is alert and generally healthy appearing.  HEENT: Normal. Pupils equal. Good dentition.  Lungs: Clear to auscultation and symmetric breath sounds. Heart:  RRR. No murmur or rub. Abdomen: Soft. No mass. No tenderness.  Normal bowel sounds.   Ileostomy RLQ okay.  I removed bridge.  Staples removed from wound.  Slight separation of lower wound. Extremities:  Good strength and ROM  in upper and lower extremities.  DATA REVIEWED: Path given to patient

## 2011-09-19 NOTE — Discharge Summary (Signed)
NAMEBRALON, ANTKOWIAK NO.:  [id number]  MEDICAL RECORD NO.:  [id number]  LOCATION:  1523                         FACILITY:  [facility]  PHYSICIAN:  Sandria Bales. Ezzard Standing, M.D.  DATE OF BIRTH:  January 30, 1961  DATE OF ADMISSION:  09/01/2011 DATE OF DISCHARGE:  09/07/2011                              DISCHARGE SUMMARY   DISCHARGE DIAGNOSES: 1. Rectal cancer (ypT1, N0, M0) 2. Quit smoking in July 2012. 3. Atypical chest pain with negative cardiac evaluation by Dr.     [name]. 4. Benigh nodule of left lobe of liver. 5. Benign appendix.  OPERATIONS PERFORMED:  The patient had a laparoscopic-assisted low- anterior resection, mobilization of splenic flexure, biopsy of the dome of the left lobe of liver, appendectomy, protective loop ileostomy on September 01, 2011.  HISTORY OF PRESENT ILLNESS:  Mr. Forgette is a 50 year old white male who sees [name] who was diagnosed with a rectal cancer on colonoscopy by Dr. Sharrell Ku in the summer of 2012.  He had some bleeding that he attributed to hemorrhoids, but had no prior GI history or history of colon cancer.  In colonoscopy, [name] found a polypoid tumor 12 cm from anal verge.  His pathology showed adenocarcinoma.  [name] was able to shave off some of the tumor.  The patient underwent endorectal ultrasound by Dr. Willis Modena, which revealed an apparent T3, possible N1 tumor.  He had a CT scan of the abdomen and pelvis on June 08, 2011, which showed this polypoid lesion is in his rectum.  There is also a single lesion in the dome of the right lobe of the liver that measured about 2- 3 mm.  He saw Dr. Arnoldo Lenis, [name], and underwent neoadjuvant chemo radiation, which she completed on July 27, 2011.  He originally saw, Dr. Almond Lint in our office for consultation, but requested ICM as a second opinion and wanted me to perform the surgery.  PAST MEDICAL HISTORY:  He quit smoking this  summer when he was diagnosed with colon cancer.  Secondly, he had had some atypical chest pain, in which he was evaluated by Dr. Rollene Rotunda preoperatively and found to have no obvious significant coronary artery disease.  HOSPITAL COURSE:  The patient completed an antibiotic and mechanical bowel prep at home and presented to [name] on September 01, 2011.  He underwent a laparoscopic-assisted low anterior resection, mobilization of splenic flexure, biopsy of the dome of the left lobe of liver, an appendectomy, the protective loop diverting ileostomy by Dr. Ovidio Kin on October 4.  Postoperatively, he did well.  His first postop day, his hemoglobin was 10 and white blood count was 12,500.  His electrolytes were normal.  He had an NG tube that was removed on the first postoperative day, a Foley which was removed on the 2nd postoperative day.  His ostomy started working, was started on clear liquids.  He is now 5 days postop.  He is doing well with liquids to be advanced to regular diet with anticipated discharge tomorrow.  His final pathology came back with an invasive adenocarcinoma of the rectum that was a pathologic T1, said this would  be a ypT1, N0, he had 0/17 lymph nodes.  DISCHARGE INSTRUCTIONS:  Will include; 1. Vicodin for pain. 2. He will be given supplies for his ileostomy.  He has a bridge on     the ileostomy that was actually removed from my first postop visit     with him.  He will leave the staples in his midline wound, take out     the small staples from his laparoscopic sites.   3.  He knows to see me back in about 7 to 10 days for followup.  He knows to call for any     interval problem.  He should not drive for 4-5 days      discomfort and call for any other intervening problems.   4.  He has followup with [name] for possibly further adjuvant     treatment.  DISCHARGE CONDITION:  Good.   Sandria Bales. Ezzard Standing, M.D., FACS   DHN/MEDQ   D:  09/06/2011  T:  09/06/2011  Job:  [id number]  cc:   Radene Gunning, M.D., Ph.D. Fax: [phone]  Griffith Citron, M.D. Fax: [phone]  Rollene Rotunda, MD, [facility] [address]  [address] [place] [address]  Dr. Myna Hidalgo, M.D. Fax: [phone]  Electronically Signed by Ovidio Kin M.D. on 09/19/2011 09:52:47 AM

## 2011-09-23 ENCOUNTER — Encounter (INDEPENDENT_AMBULATORY_CARE_PROVIDER_SITE_OTHER): Payer: Self-pay

## 2011-09-23 NOTE — Progress Notes (Signed)
Received request from [facility] for request for refill Hydrocodone 5-325 #30 take 1-2 po q 6hrs prn pain.  Dr Ezzard Standing approved this and I faxed it to [phone] for #30 and no refills.

## 2011-09-26 ENCOUNTER — Encounter (HOSPITAL_BASED_OUTPATIENT_CLINIC_OR_DEPARTMENT_OTHER): Payer: 59 | Admitting: Oncology

## 2011-09-26 DIAGNOSIS — C20 Malignant neoplasm of rectum: Secondary | ICD-10-CM

## 2011-09-26 DIAGNOSIS — Z5111 Encounter for antineoplastic chemotherapy: Secondary | ICD-10-CM

## 2011-09-26 DIAGNOSIS — G8918 Other acute postprocedural pain: Secondary | ICD-10-CM

## 2011-09-26 DIAGNOSIS — Z9889 Other specified postprocedural states: Secondary | ICD-10-CM

## 2011-10-18 ENCOUNTER — Telehealth (INDEPENDENT_AMBULATORY_CARE_PROVIDER_SITE_OTHER): Payer: Self-pay | Admitting: Surgery

## 2011-10-24 ENCOUNTER — Encounter (INDEPENDENT_AMBULATORY_CARE_PROVIDER_SITE_OTHER): Payer: Self-pay

## 2011-10-24 ENCOUNTER — Telehealth (INDEPENDENT_AMBULATORY_CARE_PROVIDER_SITE_OTHER): Payer: Self-pay

## 2011-10-24 NOTE — Telephone Encounter (Signed)
Per Dr Ezzard Standing on 11/21 he wants to see the pt 1st at his appointment and then decide about plans and extensions.

## 2011-10-24 NOTE — Telephone Encounter (Signed)
Patient called me back an stated he needs his leave covered after the 3rd so he won't get fired.  We will see him on the 6th and the 7th is Friday.  I faxed a note to his work stating we extend until after our office visit.  He can go to work on the 10th until we revise the leave.  I faxed to [phone] [employer].

## 2011-10-25 ENCOUNTER — Other Ambulatory Visit: Payer: Self-pay | Admitting: *Deleted

## 2011-10-25 DIAGNOSIS — C20 Malignant neoplasm of rectum: Secondary | ICD-10-CM

## 2011-10-26 ENCOUNTER — Other Ambulatory Visit: Payer: Self-pay | Admitting: Oncology

## 2011-10-26 ENCOUNTER — Ambulatory Visit (HOSPITAL_BASED_OUTPATIENT_CLINIC_OR_DEPARTMENT_OTHER): Payer: 59 | Admitting: Oncology

## 2011-10-26 ENCOUNTER — Telehealth: Payer: Self-pay | Admitting: Oncology

## 2011-10-26 ENCOUNTER — Other Ambulatory Visit (HOSPITAL_BASED_OUTPATIENT_CLINIC_OR_DEPARTMENT_OTHER): Payer: 59 | Admitting: Lab

## 2011-10-26 VITALS — BP 118/77 | HR 79 | Temp 99.0°F | Ht 71.0 in | Wt 187.9 lb

## 2011-10-26 DIAGNOSIS — J309 Allergic rhinitis, unspecified: Secondary | ICD-10-CM

## 2011-10-26 DIAGNOSIS — C20 Malignant neoplasm of rectum: Secondary | ICD-10-CM

## 2011-10-26 DIAGNOSIS — Z86010 Personal history of colonic polyps: Secondary | ICD-10-CM

## 2011-10-26 LAB — COMPREHENSIVE METABOLIC PANEL
ALT: 29 U/L (ref 0–53)
AST: 19 U/L (ref 0–37)
Albumin: 4.2 g/dL (ref 3.5–5.2)
Alkaline Phosphatase: 37 U/L — ABNORMAL LOW (ref 39–117)
BUN: 21 mg/dL (ref 6–23)
CO2: 27 mEq/L (ref 19–32)
Calcium: 9.2 mg/dL (ref 8.4–10.5)
Chloride: 105 mEq/L (ref 96–112)
Creatinine, Ser: 1.42 mg/dL — ABNORMAL HIGH (ref 0.50–1.35)
Glucose, Bld: 117 mg/dL — ABNORMAL HIGH (ref 70–99)
Potassium: 4.3 mEq/L (ref 3.5–5.3)
Sodium: 140 mEq/L (ref 135–145)
Total Bilirubin: 0.6 mg/dL (ref 0.3–1.2)
Total Protein: 6.8 g/dL (ref 6.0–8.3)

## 2011-10-26 LAB — CBC WITH DIFFERENTIAL/PLATELET
BASO%: 0.4 % (ref 0.0–2.0)
Basophils Absolute: 0 10*3/uL (ref 0.0–0.1)
EOS%: 3.7 % (ref 0.0–7.0)
Eosinophils Absolute: 0.2 10*3/uL (ref 0.0–0.5)
HCT: 42.6 % (ref 38.4–49.9)
HGB: 14.4 g/dL (ref 13.0–17.1)
LYMPH%: 13.6 % — ABNORMAL LOW (ref 14.0–49.0)
MCH: 33.2 pg (ref 27.2–33.4)
MCHC: 33.8 g/dL (ref 32.0–36.0)
MCV: 98.2 fL — ABNORMAL HIGH (ref 79.3–98.0)
MONO#: 0.6 10*3/uL (ref 0.1–0.9)
MONO%: 11.5 % (ref 0.0–14.0)
NEUT#: 3.6 10*3/uL (ref 1.5–6.5)
NEUT%: 70.8 % (ref 39.0–75.0)
Platelets: 280 10*3/uL (ref 140–400)
RBC: 4.34 10*6/uL (ref 4.20–5.82)
RDW: 15.7 % — ABNORMAL HIGH (ref 11.0–14.6)
WBC: 5 10*3/uL (ref 4.0–10.3)
lymph#: 0.7 10*3/uL — ABNORMAL LOW (ref 0.9–3.3)

## 2011-10-26 MED ORDER — CAPECITABINE 500 MG PO TABS: 2000.0000 mg | ORAL_TABLET | Freq: Two times a day (BID) | ORAL | Status: AC

## 2011-10-26 NOTE — Telephone Encounter (Signed)
gve the pt his dec 2012 appt calendar °

## 2011-10-27 NOTE — Progress Notes (Signed)
OFFICE PROGRESS NOTE   INTERVAL HISTORY:   [name] returns as scheduled. He completed a cycle of Xeloda beginning on November 9. He denies mouth sores and hand/foot pain. He had several episodes of chest heaviness beginning on November 11. He reports waking up during the night and feeling like "a fist was in my throat". This was followed by pain over his extremities. The symptoms spontaneously resolved over 30-45 minutes. This occurred on at least 2 evenings during the first week of Xeloda therapy. He reports several milder episodes over the past one week. There are no associated symptoms including nausea, diaphoresis, and shortness of breath.  He reports mild "burning" at the anus.  Objective:  Vital signs in last 24 hours:  Blood pressure 118/77, pulse 79, temperature 99 F (37.2 C), temperature source Oral, height 5\' 11"  (1.803 m), weight 187 lb 14.4 oz (85.231 kg).    HEENT: No thrush or ulcers Resp: Lungs clear bilaterally Cardio: Regular rate and rhythm GI: Abdomen is nontender. No hepatomegaly. Right lower quadrant colostomy. Radiation hyperpigmentation of the perineum. No skin breakdown. Vascular: No leg edema. The left lower leg is slightly larger than the right side  Skin: Palms without erythema.    Lab Results:  CBC  Lab Results  Component Value Date   WBC 5.0 10/26/2011   HGB 14.4 10/26/2011   HCT 42.6 10/26/2011   MCV 98.2* 10/26/2011   PLT 280 10/26/2011        Studies/Results:  No results found.  Medications: I have reviewed the patient's current medications.  Assessment/Plan: 1. Rectal cancer, clinical stage III (uT3, uN1), status post an endoscopic biopsy on 05/30/2011.   a. Initiation of concurrent Xeloda and radiation on 06/20/2011, completed on 07/27/2011. b. Low anterior resection on 09/01/2011 with the pathology confirming a yPT1 N0 tumor. c. Initiation of adjuvant Xeloda chemotherapy with cycle #1 beginning 10/07/2011 2. History of  multiple colorectal polyps. 3. Allergic rhinitis. 4.     History of "chest heaviness and arm-leg pain" with a "hot" feeling during the course of chemotherapy and radiation, status post a negative cardiac evaluation by [name]. He reports several episodes of similar symptoms after beginning the most recent cycle of Xeloda chemotherapy. I have a low clinical suspicion for a primary cardiac process. The symptoms may be related to reflux or esophageal spasm. He will contact us if he develops similar symptoms with the next cycle of Xeloda.   Disposition:  The plan is to begin the next cycle of Xeloda on November 30. He will contact us for recurrent chest heaviness. He'll return for an office visit in 3 weeks. He plans to meet with Dr. Ezzard Standing to discuss reversal of the temporary ostomy. I explained that it will be best to wait until the completion of adjuvant chemotherapy. He will not complete the planned course of chemotherapy until February or March of 2013.   Lucile Shutters, MD  10/27/2011  6:54 AM

## 2011-10-28 ENCOUNTER — Telehealth: Payer: Self-pay | Admitting: *Deleted

## 2011-10-28 NOTE — Telephone Encounter (Signed)
Patient was to begin xeloda today but may not be able to until 10-29-11 or as late as 10-31-11 when he receives shipment from [facility].  E-Rx for Xeloda authorized on 10-26-11 by MD.  Triage received xeloda refill request, called [facility] for clarification.  "The cycle off was not included so the E-Rx was not accepted." Obtained signature for refill which was faxed today at 10:23 am.  [facility] stated [facility] will be shipped today.  Spoke with patient at 5:15pm explaining the delay, asking that he call next week to let us know when he does begin the xeloda.

## 2011-10-31 ENCOUNTER — Telehealth: Payer: Self-pay | Admitting: *Deleted

## 2011-10-31 NOTE — Telephone Encounter (Signed)
Patient reports delivery of his Xeloda Saturday and he started it on 10/29/11.

## 2011-11-03 ENCOUNTER — Encounter (INDEPENDENT_AMBULATORY_CARE_PROVIDER_SITE_OTHER): Payer: Self-pay

## 2011-11-03 ENCOUNTER — Encounter (INDEPENDENT_AMBULATORY_CARE_PROVIDER_SITE_OTHER): Payer: Self-pay | Admitting: Surgery

## 2011-11-03 ENCOUNTER — Ambulatory Visit (INDEPENDENT_AMBULATORY_CARE_PROVIDER_SITE_OTHER): Payer: 59 | Admitting: Surgery

## 2011-11-03 VITALS — BP 170/106 | HR 72 | Temp 98.4°F | Resp 16 | Ht 71.0 in | Wt 186.8 lb

## 2011-11-03 DIAGNOSIS — C20 Malignant neoplasm of rectum: Secondary | ICD-10-CM

## 2011-11-03 NOTE — Progress Notes (Signed)
ASSESSMENT AND PLAN: 1. Rectal Cancer.   12 cm from anal verge.   Final path - invasive adenocarcinoma,  pyT1, N0, M0  (Surgery 09/01/2011)  Tumor regression - Grade 1 of III  (Stage III by ractal ultrasound, preoperative)   Completed neoadjuvant chemo and radiation therapy 07/27/2011. Chemotx supervised by Casey [name]. [name]. Radiation therapy supervised by Casey [name].   GI doctor - Casey [name]. Casey [name].  Patient is undergoing post op treatment with Xeloda.  He is expecting to finish Xeloda on 01/11/2011.  We will schedule a gastrograffin enema at the end of January 2013, in anticipation of reversing his ileostomy.  I'll see him the beginning of February and set a date for surgery around 3 weeks after finishing Xeloda.  I wrote him a note to be out of work until 02/26/2011 (after the ostomy is reversed.).   2.  Protective loop ileostomy,  See above about reversing this.  I agree with Casey [name] that it would be better to wait until the end of his Xeloda to reverse the ostomy.   3. Quit smoking in July 2012.   4. Atypical chest pain. Negative stress test.  He saw Casey [name]. Casey [name] 08/26/11.  HISTORY OF PRESENT ILLNESS: Chief Complaint  Patient presents with  . Follow-up    reck rectal ca and colon resection    Casey [name] is a 50 y.o. (DOB: [birth year])  white male who is a patient of Casey C, MD and comes to me today for follow up of rectal resection.  Casey [name] had a LAR on 09/01/2011 for a rectal cancer at 10-12 cm from anal verge.  He has a protective ileostomy.  He has done well, though still has some tenderness.  He comes accompanied with his wife.  He is on Xeloda, which he thinks will go through mid February.  We will schedule his ostomy reversal after the completion of the treatment.  PHYSICAL EXAM: BP 170/106  Pulse 72  Temp(Src) 98.4 F (36.9 [name]) (Temporal)  Resp 16  Ht 5\' 11"  (1.803 m)  Wt 186 lb 12.8 oz (84.732 kg)  BMI 26.05 kg/m2  General: WNWM who is alert and  generally healthy appearing.  HEENT: Normal. Pupils equal. Good dentition.  Lungs: Clear to auscultation and symmetric breath sounds. Heart:  RRR. No murmur or rub. Abdomen: Soft. No mass. No tenderness.  Normal bowel sounds.   Ileostomy RLQ okay. He talked about a white area under the ostomy.  He is treating it with ostomy powder. Extremities:  Good strength and ROM  in upper and lower extremities.  DATA REVIEWED: None.

## 2011-11-03 NOTE — Patient Instructions (Signed)
1.  We'll the colon x-ray towards the end of Jan 2013.  2.  I'll see you at the end of Jan/beginning of Feb, 2013.  3.  We'll aim to do the ostomy reversal 3 weeks after the end of Xeloda.

## 2011-11-04 ENCOUNTER — Other Ambulatory Visit (INDEPENDENT_AMBULATORY_CARE_PROVIDER_SITE_OTHER): Payer: Self-pay

## 2011-11-04 DIAGNOSIS — Z933 Colostomy status: Secondary | ICD-10-CM

## 2011-11-14 ENCOUNTER — Other Ambulatory Visit: Payer: Self-pay | Admitting: *Deleted

## 2011-11-14 NOTE — Telephone Encounter (Addendum)
RECEIVED A FAX FROM [employer] CONCERNING A PRESCRIPTION REFILL REQUEST FOR XELODA. THIS REQUEST WAS GIVEN TO DR.[name]'S NURSE, SUSAN COWARD,RN.

## 2011-11-15 ENCOUNTER — Telehealth: Payer: Self-pay | Admitting: Oncology

## 2011-11-15 ENCOUNTER — Other Ambulatory Visit: Payer: Self-pay | Admitting: *Deleted

## 2011-11-15 ENCOUNTER — Ambulatory Visit (HOSPITAL_BASED_OUTPATIENT_CLINIC_OR_DEPARTMENT_OTHER): Payer: 59 | Admitting: Oncology

## 2011-11-15 ENCOUNTER — Other Ambulatory Visit (HOSPITAL_BASED_OUTPATIENT_CLINIC_OR_DEPARTMENT_OTHER): Payer: 59 | Admitting: Lab

## 2011-11-15 VITALS — BP 146/92 | HR 83 | Temp 97.6°F | Ht 71.0 in | Wt 190.3 lb

## 2011-11-15 DIAGNOSIS — Z09 Encounter for follow-up examination after completed treatment for conditions other than malignant neoplasm: Secondary | ICD-10-CM

## 2011-11-15 DIAGNOSIS — C20 Malignant neoplasm of rectum: Secondary | ICD-10-CM

## 2011-11-15 DIAGNOSIS — Z923 Personal history of irradiation: Secondary | ICD-10-CM

## 2011-11-15 LAB — CBC WITH DIFFERENTIAL/PLATELET
BASO%: 0.3 % (ref 0.0–2.0)
Basophils Absolute: 0 10*3/uL (ref 0.0–0.1)
EOS%: 2.4 % (ref 0.0–7.0)
Eosinophils Absolute: 0.1 10*3/uL (ref 0.0–0.5)
HCT: 42.7 % (ref 38.4–49.9)
HGB: 14.6 g/dL (ref 13.0–17.1)
LYMPH%: 15.4 % (ref 14.0–49.0)
MCH: 33.5 pg — ABNORMAL HIGH (ref 27.2–33.4)
MCHC: 34.3 g/dL (ref 32.0–36.0)
MCV: 97.7 fL (ref 79.3–98.0)
MONO#: 0.6 10*3/uL (ref 0.1–0.9)
MONO%: 11.3 % (ref 0.0–14.0)
NEUT#: 3.5 10*3/uL (ref 1.5–6.5)
NEUT%: 70.6 % (ref 39.0–75.0)
Platelets: 299 10*3/uL (ref 140–400)
RBC: 4.37 10*6/uL (ref 4.20–5.82)
RDW: 17.8 % — ABNORMAL HIGH (ref 11.0–14.6)
WBC: 4.9 10*3/uL (ref 4.0–10.3)
lymph#: 0.8 10*3/uL — ABNORMAL LOW (ref 0.9–3.3)

## 2011-11-15 LAB — COMPREHENSIVE METABOLIC PANEL
ALT: 62 U/L — ABNORMAL HIGH (ref 0–53)
AST: 32 U/L (ref 0–37)
Albumin: 4.3 g/dL (ref 3.5–5.2)
Alkaline Phosphatase: 36 U/L — ABNORMAL LOW (ref 39–117)
BUN: 16 mg/dL (ref 6–23)
CO2: 28 mEq/L (ref 19–32)
Calcium: 9.1 mg/dL (ref 8.4–10.5)
Chloride: 106 mEq/L (ref 96–112)
Creatinine, Ser: 1.34 mg/dL (ref 0.50–1.35)
Glucose, Bld: 106 mg/dL — ABNORMAL HIGH (ref 70–99)
Potassium: 4 mEq/L (ref 3.5–5.3)
Sodium: 140 mEq/L (ref 135–145)
Total Bilirubin: 0.5 mg/dL (ref 0.3–1.2)
Total Protein: 6.7 g/dL (ref 6.0–8.3)

## 2011-11-15 MED ORDER — CAPECITABINE 500 MG PO TABS: 2000.0000 mg | ORAL_TABLET | Freq: Two times a day (BID) | ORAL | Status: DC

## 2011-11-15 NOTE — Progress Notes (Signed)
OFFICE PROGRESS NOTE   INTERVAL HISTORY:  He completed another cycle of Xeloda beginning on December 1. He tolerated the Xeloda well. He denies nausea, mouth sores, and hand/foot erythema or pain. The burning at the anus has improved. He reports "tingling" at the fingers. He saw Dr. Ezzard Standing and will be scheduled for reversal of the ostomy after the completion of chemotherapy. He reports the chest heaviness and burning was much less apparent during the recent cycle of chemotherapy.  Objective:  Vital signs in last 24 hours:  Blood pressure 146/92, pulse 83, temperature 97.6 F (36.4 C), temperature source Oral, height 5\' 11"  (1.803 m), weight 190 lb 4.8 oz (86.32 kg).    HEENT: No thrush or ulcers Resp: Lungs clear bilaterally Cardio: Regular rate and rhythm GI: Abdomen is nontender. No hepatomegaly. Right lower quadrant colostomy. Radiation hyperpigmentation of the perineum. No skin breakdown. Vascular: No leg edema.  Skin: Palms without erythema. Sole of the right foot without erythema or skin breakdown   Lab Results:  CBC  Lab Results  Component Value Date   WBC 4.9 11/15/2011   HGB 14.6 11/15/2011   HCT 42.7 11/15/2011   MCV 97.7 11/15/2011   PLT 299 11/15/2011        Studies/Results:  No results found.  Medications: I have reviewed the patient's current medications.  Assessment/Plan: 1. Rectal cancer, clinical stage III (uT3, uN1), status post an endoscopic biopsy on 05/30/2011.   a. Initiation of concurrent Xeloda and radiation on 06/20/2011, completed on 07/27/2011. b. Low anterior resection on 09/01/2011 with the pathology confirming a yPT1 N0 tumor. c. Initiation of adjuvant Xeloda chemotherapy with cycle #1 beginning 10/07/2011, cycle #2 10/29/2011 2. History of multiple colorectal polyps. 3. Allergic rhinitis. 4.     History of "chest heaviness and arm-leg pain" with a "hot" feeling during the course of chemotherapy and radiation, status post a negative  cardiac evaluation by [name]. He reports several episodes of similar symptoms after beginning the most recent cycle of Xeloda chemotherapy. I have a low clinical suspicion for a primary cardiac process. The symptoms may be related to reflux or esophageal spasm. He had similar symptoms though to a much lesser degree and frequency following the most recent cycle of chemotherapy.   Disposition:  The plan is to begin the next cycle of Xeloda on December 27.  He'll return for an office visit in 3 weeks.   Lucile Shutters, MD  11/15/2011  1:57 PM

## 2011-11-15 NOTE — Telephone Encounter (Signed)
gv pt appt schedule for jan °

## 2011-11-18 ENCOUNTER — Ambulatory Visit: Payer: 59 | Admitting: Radiation Oncology

## 2011-11-28 ENCOUNTER — Encounter: Payer: Self-pay | Admitting: *Deleted

## 2011-11-28 DIAGNOSIS — Z923 Personal history of irradiation: Secondary | ICD-10-CM | POA: Insufficient documentation

## 2011-11-28 DIAGNOSIS — J309 Allergic rhinitis, unspecified: Secondary | ICD-10-CM | POA: Insufficient documentation

## 2011-12-02 ENCOUNTER — Encounter: Payer: Self-pay | Admitting: Radiation Oncology

## 2011-12-02 ENCOUNTER — Ambulatory Visit
Admission: RE | Admit: 2011-12-02 | Discharge: 2011-12-02 | Disposition: A | Discharge status: Home / Self Care | Payer: 59 | Source: Ambulatory Visit | Attending: Radiation Oncology | Admitting: Radiation Oncology

## 2011-12-02 VITALS — BP 140/87 | HR 80 | Temp 97.7°F | Resp 20 | Wt 197.2 lb

## 2011-12-02 DIAGNOSIS — C20 Malignant neoplasm of rectum: Secondary | ICD-10-CM

## 2011-12-02 NOTE — Progress Notes (Signed)
Pt states he has slight burning at rectum, has small amt mucus discharge. Pt currently has ileostomy, for reversal after completion of Xeloda.  Pt had cardiac stress test prior to surgery; WNL.

## 2011-12-02 NOTE — Progress Notes (Signed)
CC:   Ladene Artist, M.D. Sandria Bales. Ezzard Standing, M.D. Willis Modena, MD  DIAGNOSIS:  Rectal cancer.  INTERVAL HISTORY:  Casey [name] returns to the clinic today for followup.  He was last seen in September.  The patient has done very well since he was last seen.  He did undergo his surgery on 09/01/2011 and appeared to have a very nice response after his preoperative course of chemoradiotherapy corresponded to a pT1 pN0 tumor.  The margins were negative and none of the 17 lymph nodes examined demonstrated carcinoma. He is continuing with adjuvant Xeloda and is planning to have a reversal completed after this through Dr. Ezzard Standing.  He has had some slight rectal irritation, but this has improved over the last week or 2.  He has noticed a little bit of mucus discharge, but no other significant findings or problems.  PHYSICAL EXAMINATION:  Vital Signs:  Weight 197 pounds.  Blood pressure 140/87.  Pulse 80.  Respiratory rate 20.  Temperature 97.7.  General Appearance:  A well-developed male in no acute distress.  Rectal Exam: The rectal and anal region look quite good with some mild radiation skin change present.  No bright erythema, desquamation, or significant ongoing reaction present.  IMPRESSION AND PLAN:  Casey [name] has done very well with his treatment until this point and I am pleased with the results of surgery. He is finishing up chemotherapy with Dr. Truett Perna within the next month or so and then will undergo reversal with Dr. Ezzard Standing.  I am going to have Casey [name] return to our clinic on a p.r.n. basis.  I spent 15 minutes with Casey [name] today, the majority of which was spent counseling him on his diagnosis and coordinating his care.    ______________________________ Radene Gunning, M.D., Ph.D. JSM/MEDQ  D:  12/02/2011  T:  12/02/2011  Job:  [id number]

## 2011-12-07 ENCOUNTER — Ambulatory Visit (HOSPITAL_BASED_OUTPATIENT_CLINIC_OR_DEPARTMENT_OTHER): Payer: 59 | Admitting: Oncology

## 2011-12-07 ENCOUNTER — Other Ambulatory Visit (HOSPITAL_BASED_OUTPATIENT_CLINIC_OR_DEPARTMENT_OTHER): Payer: 59 | Admitting: Lab

## 2011-12-07 ENCOUNTER — Telehealth: Payer: Self-pay | Admitting: Oncology

## 2011-12-07 VITALS — BP 143/87 | HR 78 | Temp 98.4°F | Ht 71.0 in | Wt 193.3 lb

## 2011-12-07 DIAGNOSIS — C20 Malignant neoplasm of rectum: Secondary | ICD-10-CM

## 2011-12-07 DIAGNOSIS — Z923 Personal history of irradiation: Secondary | ICD-10-CM

## 2011-12-07 DIAGNOSIS — Z09 Encounter for follow-up examination after completed treatment for conditions other than malignant neoplasm: Secondary | ICD-10-CM

## 2011-12-07 LAB — CBC WITH DIFFERENTIAL/PLATELET
BASO%: 0.4 % (ref 0.0–2.0)
Basophils Absolute: 0 10*3/uL (ref 0.0–0.1)
EOS%: 2.3 % (ref 0.0–7.0)
Eosinophils Absolute: 0.1 10*3/uL (ref 0.0–0.5)
HCT: 45 % (ref 38.4–49.9)
HGB: 15.3 g/dL (ref 13.0–17.1)
LYMPH%: 16.1 % (ref 14.0–49.0)
MCH: 33.1 pg (ref 27.2–33.4)
MCHC: 34 g/dL (ref 32.0–36.0)
MCV: 97.3 fL (ref 79.3–98.0)
MONO#: 0.5 10*3/uL (ref 0.1–0.9)
MONO%: 11.2 % (ref 0.0–14.0)
NEUT#: 3.4 10*3/uL (ref 1.5–6.5)
NEUT%: 70 % (ref 39.0–75.0)
Platelets: 233 10*3/uL (ref 140–400)
RBC: 4.62 10*6/uL (ref 4.20–5.82)
RDW: 19.7 % — ABNORMAL HIGH (ref 11.0–14.6)
WBC: 4.8 10*3/uL (ref 4.0–10.3)
lymph#: 0.8 10*3/uL — ABNORMAL LOW (ref 0.9–3.3)

## 2011-12-07 LAB — COMPREHENSIVE METABOLIC PANEL
ALT: 63 U/L — ABNORMAL HIGH (ref 0–53)
AST: 32 U/L (ref 0–37)
Albumin: 4.5 g/dL (ref 3.5–5.2)
Alkaline Phosphatase: 41 U/L (ref 39–117)
BUN: 18 mg/dL (ref 6–23)
CO2: 25 mEq/L (ref 19–32)
Calcium: 9.5 mg/dL (ref 8.4–10.5)
Chloride: 104 mEq/L (ref 96–112)
Creatinine, Ser: 1.33 mg/dL (ref 0.50–1.35)
Glucose, Bld: 107 mg/dL — ABNORMAL HIGH (ref 70–99)
Potassium: 4.5 mEq/L (ref 3.5–5.3)
Sodium: 138 mEq/L (ref 135–145)
Total Bilirubin: 0.6 mg/dL (ref 0.3–1.2)
Total Protein: 6.8 g/dL (ref 6.0–8.3)

## 2011-12-07 NOTE — Telephone Encounter (Signed)
appt made for 1/30 per gbs,card made for pt     aom

## 2011-12-07 NOTE — Progress Notes (Signed)
OFFICE PROGRESS NOTE   INTERVAL HISTORY:   She returns as scheduled. He completed another cycle of Xeloda on January 5. He reports developing a burning sensation at the soles of the feet beginning in the early a.m. on January 6. This resolved by January 7. He reports a few minor episodes of chest "heaviness "during the most recent cycle of chemotherapy. He denies mouth sores and diarrhea.  Objective:  Vital signs in last 24 hours:  Blood pressure 143/87, pulse 78, temperature 98.4 F (36.9 C), temperature source Oral, height 5\' 11"  (1.803 m), weight 193 lb 4.8 oz (87.68 kg).    HEENT: No thrush or ulcers Resp: Lungs clear bilaterally Cardio: Regular rate and rhythm GI: Nontender, no hepatomegaly, right lower quadrant ostomy Vascular: No leg edema  Skin: Palms and soles without erythema or skin breakdown.   Lab Results:  Lab Results  Component Value Date   WBC 4.8 12/07/2011   HGB 15.3 12/07/2011   HCT 45.0 12/07/2011   MCV 97.3 12/07/2011   PLT 233 12/07/2011   ANC 3.4   Medications: I have reviewed the patient's current medications.  Assessment/Plan: 1. Rectal cancer, clinical stage III (uT3, uN1), status post an endoscopic biopsy on 05/30/2011.  a. Initiation of concurrent Xeloda and radiation on 06/20/2011, completed on 07/27/2011. b. Low anterior resection on 09/01/2011 with the pathology confirming a yPT1 N0 tumor. c. Initiation of adjuvant Xeloda chemotherapy with cycle #1 beginning 10/07/2011, cycle #2 10/29/2011, cycle #3 on 11/24/2011. 2. History of multiple colorectal polyps. 3. Allergic rhinitis. 4. History of "chest heaviness and arm-leg pain" with a "hot" feeling during the course of chemotherapy and radiation, status post a negative cardiac evaluation by [name].  5. 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 He will discontinue  the Xeloda and contact us if he develops significant hand or foot discomfort with this cycle. He will return for an office visit in 3 weeks.   Lucile Shutters, MD  12/07/2011  6:45 PM

## 2011-12-09 ENCOUNTER — Other Ambulatory Visit: Payer: Self-pay | Admitting: *Deleted

## 2011-12-09 DIAGNOSIS — C20 Malignant neoplasm of rectum: Secondary | ICD-10-CM

## 2011-12-09 MED ORDER — CAPECITABINE 500 MG PO TABS: 2000.0000 mg | ORAL_TABLET | Freq: Two times a day (BID) | ORAL | Status: DC

## 2011-12-22 ENCOUNTER — Ambulatory Visit (HOSPITAL_COMMUNITY)
Admission: RE | Admit: 2011-12-22 | Discharge: 2011-12-22 | Disposition: A | Discharge status: Home / Self Care | Payer: 59 | Source: Ambulatory Visit | Attending: Surgery | Admitting: Surgery

## 2011-12-22 DIAGNOSIS — Z01818 Encounter for other preprocedural examination: Secondary | ICD-10-CM | POA: Insufficient documentation

## 2011-12-22 DIAGNOSIS — Z933 Colostomy status: Secondary | ICD-10-CM

## 2011-12-22 DIAGNOSIS — Z932 Ileostomy status: Secondary | ICD-10-CM | POA: Insufficient documentation

## 2011-12-22 MED ORDER — IOHEXOL 300 MG/ML  SOLN
450.0000 mL | Freq: Once | INTRAMUSCULAR | Status: AC | PRN
  Administered 2011-12-22: 225 mL

## 2011-12-26 ENCOUNTER — Other Ambulatory Visit (INDEPENDENT_AMBULATORY_CARE_PROVIDER_SITE_OTHER): Payer: Self-pay | Admitting: Surgery

## 2011-12-27 ENCOUNTER — Other Ambulatory Visit: Payer: Self-pay | Admitting: *Deleted

## 2011-12-27 DIAGNOSIS — C20 Malignant neoplasm of rectum: Secondary | ICD-10-CM

## 2011-12-27 MED ORDER — HYDROCODONE-ACETAMINOPHEN 5-325 MG PO TABS: 1.0000 | ORAL_TABLET | Freq: Four times a day (QID) | ORAL | Status: DC | PRN

## 2011-12-28 ENCOUNTER — Telehealth: Payer: Self-pay | Admitting: Oncology

## 2011-12-28 ENCOUNTER — Ambulatory Visit (HOSPITAL_BASED_OUTPATIENT_CLINIC_OR_DEPARTMENT_OTHER): Payer: 59 | Admitting: Oncology

## 2011-12-28 ENCOUNTER — Other Ambulatory Visit: Payer: 59 | Admitting: Lab

## 2011-12-28 VITALS — BP 146/97 | HR 79 | Temp 97.4°F | Ht 71.0 in | Wt 193.1 lb

## 2011-12-28 DIAGNOSIS — C20 Malignant neoplasm of rectum: Secondary | ICD-10-CM

## 2011-12-28 LAB — CBC WITH DIFFERENTIAL/PLATELET
BASO%: 0.3 % (ref 0.0–2.0)
Basophils Absolute: 0 10*3/uL (ref 0.0–0.1)
EOS%: 2.3 % (ref 0.0–7.0)
Eosinophils Absolute: 0.1 10*3/uL (ref 0.0–0.5)
HCT: 45.8 % (ref 38.4–49.9)
HGB: 15.6 g/dL (ref 13.0–17.1)
LYMPH%: 14 % (ref 14.0–49.0)
MCH: 33.3 pg (ref 27.2–33.4)
MCHC: 33.9 g/dL (ref 32.0–36.0)
MCV: 98.2 fL — ABNORMAL HIGH (ref 79.3–98.0)
MONO#: 0.6 10*3/uL (ref 0.1–0.9)
MONO%: 11.5 % (ref 0.0–14.0)
NEUT#: 3.8 10*3/uL (ref 1.5–6.5)
NEUT%: 71.9 % (ref 39.0–75.0)
Platelets: 222 10*3/uL (ref 140–400)
RBC: 4.67 10*6/uL (ref 4.20–5.82)
RDW: 21.9 % — ABNORMAL HIGH (ref 11.0–14.6)
WBC: 5.3 10*3/uL (ref 4.0–10.3)
lymph#: 0.7 10*3/uL — ABNORMAL LOW (ref 0.9–3.3)

## 2011-12-28 LAB — COMPREHENSIVE METABOLIC PANEL
ALT: 41 U/L (ref 0–53)
AST: 27 U/L (ref 0–37)
Albumin: 4.3 g/dL (ref 3.5–5.2)
Alkaline Phosphatase: 41 U/L (ref 39–117)
BUN: 18 mg/dL (ref 6–23)
CO2: 24 mEq/L (ref 19–32)
Calcium: 9 mg/dL (ref 8.4–10.5)
Chloride: 105 mEq/L (ref 96–112)
Creatinine, Ser: 1.28 mg/dL (ref 0.50–1.35)
Glucose, Bld: 106 mg/dL — ABNORMAL HIGH (ref 70–99)
Potassium: 4 mEq/L (ref 3.5–5.3)
Sodium: 140 mEq/L (ref 135–145)
Total Bilirubin: 0.7 mg/dL (ref 0.3–1.2)
Total Protein: 6.7 g/dL (ref 6.0–8.3)

## 2011-12-28 NOTE — Telephone Encounter (Signed)
gv pt appt for feb2013 °

## 2011-12-28 NOTE — Progress Notes (Signed)
OFFICE PROGRESS NOTE   INTERVAL HISTORY:   He completed another cycle of Xeloda last Saturday. He denies mouth sores, nausea, and diarrhea. He again developed pain at the soles of the feet following this cycle of chemotherapy. This has improved. He denies chest pain.  Objective:  Vital signs in last 24 hours:  Blood pressure 146/97, pulse 79, temperature 97.4 F (36.3 C), temperature source Oral, height 5\' 11"  (1.803 m), weight 193 lb 1.6 oz (87.59 kg).    HEENT: No thrush or ulcers Resp: Lungs clear bilaterally Cardio: Regular rate and rhythm GI: No hepatomegaly, nontender, radiation hyperpigmentation at the perineum. No skin breakdown. Vascular: No leg edema  Skin: Hyperpigmentation at the hands and feet with mild erythema at the soles. No skin breakdown.   Lab Results:  Lab Results  Component Value Date   WBC 5.3 12/28/2011   HGB 15.6 12/28/2011   HCT 45.8 12/28/2011   MCV 98.2* 12/28/2011   PLT 222 12/28/2011   ANC 3.8   Medications: I have reviewed the patient's current medications.  Assessment/Plan: 1. Rectal cancer, clinical stage III (uT3, uN1), status post an endoscopic biopsy on 05/30/2011.  a. Initiation of concurrent Xeloda and radiation on 06/20/2011, completed on 07/27/2011. b. Low anterior resection on 09/01/2011 with the pathology confirming a yPT1 N0 tumor. c. Initiation of adjuvant Xeloda chemotherapy with cycle #1 beginning 10/07/2011, cycle #2 10/29/2011, cycle #3 on 11/24/2011, Cycle #4 on 12/10/2011 2. History of multiple colorectal polyps. 3. Allergic rhinitis. 4. History of "chest heaviness and arm-leg pain" with a "hot" feeling during the course of chemotherapy and radiation, status post a negative cardiac evaluation by [name].  5. 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 He will begin a final cycle of adjuvant Xeloda chemotherapy on 12/31/2011. He will see  Dr. Ezzard Standing on 01/20/2012. He will return for an office visit here on 01/25/2012.   Lucile Shutters, MD  12/28/2011  6:40 PM

## 2012-01-10 DIAGNOSIS — I10 Essential (primary) hypertension: Secondary | ICD-10-CM

## 2012-01-10 HISTORY — DX: Essential (primary) hypertension: I10

## 2012-01-20 ENCOUNTER — Ambulatory Visit (INDEPENDENT_AMBULATORY_CARE_PROVIDER_SITE_OTHER): Payer: 59 | Admitting: Surgery

## 2012-01-20 ENCOUNTER — Other Ambulatory Visit (INDEPENDENT_AMBULATORY_CARE_PROVIDER_SITE_OTHER): Payer: Self-pay | Admitting: Surgery

## 2012-01-20 ENCOUNTER — Encounter (INDEPENDENT_AMBULATORY_CARE_PROVIDER_SITE_OTHER): Payer: Self-pay | Admitting: Surgery

## 2012-01-20 VITALS — BP 138/90 | HR 78 | Temp 97.4°F | Resp 18 | Ht 71.0 in | Wt 196.4 lb

## 2012-01-20 DIAGNOSIS — C20 Malignant neoplasm of rectum: Secondary | ICD-10-CM

## 2012-01-20 NOTE — Progress Notes (Signed)
ASSESSMENT AND PLAN: 1. Rectal Cancer.   12 cm from anal verge.   Final path - invasive adenocarcinoma,  pyT1, N0, M0  (Surgery 09/01/2011)  Tumor regression - Grade 1 of III  (Stage III by ractal ultrasound, preoperative)   Completed neoadjuvant chemo and radiation therapy 07/27/2011. Chemotx supervised by Dr. B. [name]. Radiation therapy supervised by Dr. J. Moody.   GI doctor - Dr. J. Medoff.  Patient has undergone post op treatment with Xeloda.  And just finished Xeloda on 01/13/2011.    I have written him a note to be out of work until 02/26/2011 (after the ostomy is reversed.). We'll adjust that depending on the surgery.   2.  Protective loop ileostomy,  Has completed Xeloda.  Will schedule reversal of ileostomy in 3 to 4 weeks.  Discussed indication for surgery and the risks.  Risks included, but are not limited to, bleeding, infection, and leak.   3. Quit smoking in July 2012.   4. Atypical chest pain. Negative stress test.  He saw Dr. J. Hochrein 08/26/11.  HISTORY OF PRESENT ILLNESS: Chief Complaint  Patient presents with  . Follow-up    Discuss ostomy takedown    Casey [name] is a 50 y.o. (DOB: 10/06/1961)  white male who is a patient of Casey C, MD, MD and comes to me today for follow up of rectal resection.  Casey [name] had a LAR on 09/01/2011 for a rectal cancer at 10-12 cm from anal verge.  He has a protective ileostomy.  He has done well, though still has some tenderness.  He comes accompanied with his wife.  He has finished the  Xeloda, and is ready tol schedule his ostomy reversal.  I reviewed with him the surgery and answered questions.  PHYSICAL EXAM: BP 138/90  Pulse 78  Temp(Src) 97.4 F (36.3 [name]) (Temporal)  Resp 18  Ht 5' 11" (1.803 m)  Wt 196 lb 6 oz (89.075 kg)  BMI 27.39 kg/m2  General: WNWM who is alert and generally healthy appearing.  HEENT: Normal. Pupils equal. Good dentition. Lungs: Clear to auscultation and symmetric breath  sounds. Heart:  RRR. No murmur or rub.  Abdomen: Soft. No mass. No tenderness.  Normal bowel sounds. Lower midline wound okay.  Ileostomy RLQ okay.   Extemities:  Good strength and ROM  in upper and lower extremities.  DATA REVIEWED: BE on 12/21/2010 - negative.  

## 2012-01-25 ENCOUNTER — Ambulatory Visit (HOSPITAL_BASED_OUTPATIENT_CLINIC_OR_DEPARTMENT_OTHER): Payer: 59 | Admitting: Oncology

## 2012-01-25 ENCOUNTER — Telehealth: Payer: Self-pay | Admitting: Oncology

## 2012-01-25 ENCOUNTER — Ambulatory Visit: Payer: 59 | Admitting: Lab

## 2012-01-25 VITALS — BP 145/83 | HR 78 | Temp 97.3°F | Ht 71.0 in | Wt 198.2 lb

## 2012-01-25 DIAGNOSIS — C20 Malignant neoplasm of rectum: Secondary | ICD-10-CM

## 2012-01-25 DIAGNOSIS — M79609 Pain in unspecified limb: Secondary | ICD-10-CM

## 2012-01-25 DIAGNOSIS — R319 Hematuria, unspecified: Secondary | ICD-10-CM

## 2012-01-25 DIAGNOSIS — J309 Allergic rhinitis, unspecified: Secondary | ICD-10-CM

## 2012-01-25 LAB — URINALYSIS, MICROSCOPIC - CHCC
Bilirubin (Urine): NEGATIVE
Glucose: NEGATIVE g/dL
Ketones: NEGATIVE mg/dL
Leukocyte Esterase: NEGATIVE
Nitrite: NEGATIVE
Protein: 30 mg/dL
Specific Gravity, Urine: 1.03 (ref 1.003–1.035)
pH: 6 (ref 4.6–8.0)

## 2012-01-25 NOTE — Telephone Encounter (Signed)
Pt sent back to the lab and appts made for ct and md appt  aom/printed

## 2012-01-25 NOTE — Progress Notes (Signed)
OFFICE PROGRESS NOTE   INTERVAL HISTORY:   He completed the final cycle of chemotherapy. He has noted stiffness and mild discomfort in the "hip". He has dry desquamation of the skin at the soles of the feet. He is scheduled for an ostomy reversal procedure on 02/10/2012. He reports a few "drops "of blood in his urine.  Objective:  Vital signs in last 24 hours:  Blood pressure 145/83, pulse 78, temperature 97.3 F (36.3 C), temperature source Oral, height 5\' 11"  (1.803 m), weight 198 lb 3.2 oz (89.903 kg).    HEENT: No thrush or ulcers Resp: Lungs clear bilaterally Cardio: Regular rate and rhythm GI: No hepatomegaly, nontender, no mass Vascular: No leg edema  Skin: Mild skin thickening at the palms and soles with dry desquamation at the soles. No erythema. Mild hyperpigmentation at the feet.     Medications: I have reviewed the patient's current medications.  Assessment/Plan: 1. Rectal cancer, clinical stage III (uT3, uN1), status post an endoscopic biopsy on 05/30/2011.  a. Initiation of concurrent Xeloda and radiation on 06/20/2011, completed on 07/27/2011. b. Low anterior resection on 09/01/2011 with the pathology confirming a yPT1 N0 tumor. c. Initiation of adjuvant Xeloda chemotherapy with cycle #1 beginning 10/07/2011, cycle #2 10/29/2011, cycle #3 on 11/24/2011, Cycle #4 on 12/10/2011, cycle 5 on 12/31/2011.. 2. History of multiple colorectal polyps. 3. Allergic rhinitis. 4. History of "chest heaviness and arm-leg pain" with a "hot" feeling during the course of chemotherapy and radiation, status post a negative cardiac evaluation by [name].  5. 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    Disposition:  He has completed the planned course of adjuvant chemotherapy. He is scheduled for an ostomy takedown procedure on 02/10/2012.  He will return for an office visit  and restaging CT evaluation in July of 2013.   Lucile Shutters, MD  01/25/2012  9:50 AM

## 2012-01-26 ENCOUNTER — Telehealth: Payer: Self-pay | Admitting: Nurse Practitioner

## 2012-01-26 NOTE — Telephone Encounter (Signed)
Advised pt per Dr. Truett Perna- trace blood in urine.  Recheck at next visit.  Pt verbalized understanding.

## 2012-02-03 ENCOUNTER — Encounter (HOSPITAL_COMMUNITY): Payer: Self-pay | Admitting: Pharmacy Technician

## 2012-02-07 ENCOUNTER — Encounter (HOSPITAL_COMMUNITY)
Admission: RE | Admit: 2012-02-07 | Discharge: 2012-02-07 | Disposition: A | Discharge status: Home / Self Care | Payer: 59 | Source: Ambulatory Visit | Attending: Surgery | Admitting: Surgery

## 2012-02-07 ENCOUNTER — Encounter (HOSPITAL_COMMUNITY): Payer: Self-pay

## 2012-02-07 DIAGNOSIS — C20 Malignant neoplasm of rectum: Secondary | ICD-10-CM | POA: Insufficient documentation

## 2012-02-07 DIAGNOSIS — I499 Cardiac arrhythmia, unspecified: Secondary | ICD-10-CM

## 2012-02-07 DIAGNOSIS — R319 Hematuria, unspecified: Secondary | ICD-10-CM | POA: Insufficient documentation

## 2012-02-07 HISTORY — DX: Cardiac arrhythmia, unspecified: I49.9

## 2012-02-07 LAB — DIFFERENTIAL
Basophils Absolute: 0 10*3/uL (ref 0.0–0.1)
Basophils Relative: 0 % (ref 0–1)
Eosinophils Absolute: 0.2 10*3/uL (ref 0.0–0.7)
Eosinophils Relative: 4 % (ref 0–5)
Lymphocytes Relative: 14 % (ref 12–46)
Lymphs Abs: 0.8 10*3/uL (ref 0.7–4.0)
Monocytes Absolute: 0.6 10*3/uL (ref 0.1–1.0)
Monocytes Relative: 11 % (ref 3–12)
Neutro Abs: 3.8 10*3/uL (ref 1.7–7.7)
Neutrophils Relative %: 71 % (ref 43–77)

## 2012-02-07 LAB — COMPREHENSIVE METABOLIC PANEL
ALT: 48 U/L (ref 0–53)
AST: 31 U/L (ref 0–37)
Albumin: 4 g/dL (ref 3.5–5.2)
Alkaline Phosphatase: 59 U/L (ref 39–117)
BUN: 16 mg/dL (ref 6–23)
CO2: 27 mEq/L (ref 19–32)
Calcium: 9.6 mg/dL (ref 8.4–10.5)
Chloride: 101 mEq/L (ref 96–112)
Creatinine, Ser: 1.19 mg/dL (ref 0.50–1.35)
GFR calc Af Amer: 79 mL/min — ABNORMAL LOW (ref >90)
GFR calc non Af Amer: 68 mL/min — ABNORMAL LOW (ref >90)
Glucose, Bld: 104 mg/dL — ABNORMAL HIGH (ref 70–99)
Potassium: 4 mEq/L (ref 3.5–5.1)
Sodium: 138 mEq/L (ref 135–145)
Total Bilirubin: 0.6 mg/dL (ref 0.3–1.2)
Total Protein: 7.4 g/dL (ref 6.0–8.3)

## 2012-02-07 LAB — CBC
HCT: 43.8 % (ref 39.0–52.0)
Hemoglobin: 15.2 g/dL (ref 13.0–17.0)
MCH: 34.2 pg — ABNORMAL HIGH (ref 26.0–34.0)
MCHC: 34.7 g/dL (ref 30.0–36.0)
MCV: 98.6 fL (ref 78.0–100.0)
Platelets: 242 K/uL (ref 150–400)
RBC: 4.44 MIL/uL (ref 4.22–5.81)
RDW: 16.5 % — ABNORMAL HIGH (ref 11.5–15.5)
WBC: 5.4 K/uL (ref 4.0–10.5)

## 2012-02-07 LAB — SURGICAL PCR SCREEN
MRSA, PCR: NEGATIVE
Staphylococcus aureus: NEGATIVE

## 2012-02-07 MED ORDER — CHLORHEXIDINE GLUCONATE 4 % EX LIQD
1.0000 "application " | Freq: Once | CUTANEOUS | Status: DC
  Filled 2012-02-07: qty 15

## 2012-02-07 NOTE — Pre-Procedure Instructions (Addendum)
02-07-12 EKG(08-06-11), Stress (08-30-11)), Ct Chest(7'12)- all in Epic. 02-08-12- 1305 pt. Notified of change of surgery time to 0945 AM, to arrive at 0730AM- 02-10-12.W. Jebediah Macrae,RN

## 2012-02-07 NOTE — Patient Instructions (Signed)
Casey [name]  02/07/2012   Your procedure is scheduled on: 02-10-11  Report to [facility] at      0530  AM.  Call this number if you have problems the morning of surgery: [phone]   Remember:   Do not eat food:After Midnight.  Follow any bowel prep instructions per office :    Take these medicines the morning of surgery with A SIP OF WATER: Loratadine, Hydrocodone   Do not wear jewelry, make-up or nail polish.  Do not wear lotions, powders, or perfumes. You may wear deodorant.  Do not shave 48 hours prior to surgery.(face and neck okay)  Do not bring valuables to the hospital.  Contacts, dentures or bridgework may not be worn into surgery.  Leave suitcase in the car. After surgery it may be brought to your room.  For patients admitted to the hospital, checkout time is 11:00 AM the day of discharge.   Patients discharged the day of surgery will not be allowed to drive home.  Name and phone number of your driver: spouse  Special Instructions: CHG Shower Use Special Wash: 1/2 bottle night before surgery and 1/2 bottle morning of surgery.(avoid face and genitals)   Please read over the following fact sheets that you were given: MRSA Information

## 2012-02-08 LAB — CEA: CEA: <0.5 ng/mL (ref 0.0–5.0)

## 2012-02-10 ENCOUNTER — Inpatient Hospital Stay (HOSPITAL_COMMUNITY)
Admission: RE | Admit: 2012-02-10 | Discharge: 2012-02-14 | DRG: 345 | Disposition: A | Discharge status: Home / Self Care | Payer: 59 | Source: Ambulatory Visit | Attending: Surgery | Admitting: Surgery

## 2012-02-10 ENCOUNTER — Encounter (HOSPITAL_COMMUNITY)
Admission: RE | Disposition: A | Discharge status: Home / Self Care | Payer: Self-pay | Source: Ambulatory Visit | Attending: Surgery

## 2012-02-10 ENCOUNTER — Encounter (HOSPITAL_COMMUNITY): Payer: Self-pay | Admitting: Certified Registered Nurse Anesthetist

## 2012-02-10 ENCOUNTER — Encounter (HOSPITAL_COMMUNITY): Payer: Self-pay | Admitting: *Deleted

## 2012-02-10 ENCOUNTER — Ambulatory Visit (HOSPITAL_COMMUNITY): Payer: 59 | Admitting: Certified Registered Nurse Anesthetist

## 2012-02-10 DIAGNOSIS — Z87891 Personal history of nicotine dependence: Secondary | ICD-10-CM

## 2012-02-10 DIAGNOSIS — Z923 Personal history of irradiation: Secondary | ICD-10-CM

## 2012-02-10 DIAGNOSIS — Z9221 Personal history of antineoplastic chemotherapy: Secondary | ICD-10-CM

## 2012-02-10 DIAGNOSIS — Z433 Encounter for attention to colostomy: Secondary | ICD-10-CM

## 2012-02-10 DIAGNOSIS — Z432 Encounter for attention to ileostomy: Principal | ICD-10-CM

## 2012-02-10 DIAGNOSIS — C20 Malignant neoplasm of rectum: Secondary | ICD-10-CM

## 2012-02-10 DIAGNOSIS — Z01812 Encounter for preprocedural laboratory examination: Secondary | ICD-10-CM

## 2012-02-10 HISTORY — PX: ILEOSTOMY CLOSURE: SHX1784

## 2012-02-10 HISTORY — PX: COLOSTOMY TAKEDOWN: SHX5258

## 2012-02-10 HISTORY — PX: COLON SURGERY: SHX602

## 2012-02-10 SURGERY — CLOSURE, COLOSTOMY, LAPAROSCOPIC
Anesthesia: General

## 2012-02-10 MED ORDER — LACTATED RINGERS IR SOLN
Status: DC | PRN
  Administered 2012-02-10: 1

## 2012-02-10 MED ORDER — ACETAMINOPHEN 10 MG/ML IV SOLN
INTRAVENOUS | Status: DC | PRN
  Administered 2012-02-10: 1000 mg via INTRAVENOUS

## 2012-02-10 MED ORDER — SUCCINYLCHOLINE CHLORIDE 20 MG/ML IJ SOLN
INTRAMUSCULAR | Status: DC | PRN
  Administered 2012-02-10: 100 mg via INTRAVENOUS

## 2012-02-10 MED ORDER — DEXTROSE 5 % IV SOLN
2.0000 g | Freq: Once | INTRAVENOUS | Status: AC
  Administered 2012-02-10: 2 g via INTRAVENOUS
  Filled 2012-02-10: qty 2

## 2012-02-10 MED ORDER — NALOXONE HCL 0.4 MG/ML IJ SOLN: 0.4000 mg | INTRAMUSCULAR | Status: DC | PRN

## 2012-02-10 MED ORDER — 0.9 % SODIUM CHLORIDE (POUR BTL) OPTIME
TOPICAL | Status: DC | PRN
  Administered 2012-02-10: 1000 mL

## 2012-02-10 MED ORDER — KCL IN DEXTROSE-NACL 20-5-0.45 MEQ/L-%-% IV SOLN
INTRAVENOUS | Status: DC
  Administered 2012-02-10: 150 mL/h via INTRAVENOUS
  Administered 2012-02-10 – 2012-02-12 (×5): via INTRAVENOUS
  Administered 2012-02-13: 50 mL/h via INTRAVENOUS
  Administered 2012-02-14: 09:00:00 via INTRAVENOUS
  Filled 2012-02-10 (×13): qty 1000

## 2012-02-10 MED ORDER — FENTANYL CITRATE 0.05 MG/ML IJ SOLN
INTRAMUSCULAR | Status: DC | PRN
  Administered 2012-02-10 (×7): 50 ug via INTRAVENOUS

## 2012-02-10 MED ORDER — HYDROMORPHONE HCL PF 1 MG/ML IJ SOLN: 0.2500 mg | INTRAMUSCULAR | Status: DC | PRN

## 2012-02-10 MED ORDER — ALVIMOPAN 12 MG PO CAPS
12.0000 mg | ORAL_CAPSULE | Freq: Once | ORAL | Status: AC
  Administered 2012-02-10: 12 mg via ORAL

## 2012-02-10 MED ORDER — LIDOCAINE HCL (CARDIAC) 20 MG/ML IV SOLN
INTRAVENOUS | Status: DC | PRN
  Administered 2012-02-10: 100 mg via INTRAVENOUS

## 2012-02-10 MED ORDER — LACTATED RINGERS IV SOLN
INTRAVENOUS | Status: DC
  Administered 2012-02-10: 11:00:00 via INTRAVENOUS
  Administered 2012-02-10: 1000 mL via INTRAVENOUS

## 2012-02-10 MED ORDER — MIDAZOLAM HCL 5 MG/5ML IJ SOLN
INTRAMUSCULAR | Status: DC | PRN
  Administered 2012-02-10: 2 mg via INTRAVENOUS

## 2012-02-10 MED ORDER — BUPIVACAINE-EPINEPHRINE PF 0.25-1:200000 % IJ SOLN
INTRAMUSCULAR | Status: AC
  Filled 2012-02-10: qty 30

## 2012-02-10 MED ORDER — HEPARIN SODIUM (PORCINE) 5000 UNIT/ML IJ SOLN
5000.0000 [IU] | Freq: Once | INTRAMUSCULAR | Status: AC
  Administered 2012-02-10: 5000 [IU] via SUBCUTANEOUS

## 2012-02-10 MED ORDER — MORPHINE SULFATE (PF) 1 MG/ML IV SOLN
INTRAVENOUS | Status: AC
  Filled 2012-02-10: qty 25

## 2012-02-10 MED ORDER — MORPHINE SULFATE (PF) 1 MG/ML IV SOLN
INTRAVENOUS | Status: DC
  Administered 2012-02-10: 9 mg via INTRAVENOUS
  Administered 2012-02-10: 4.5 mg via INTRAVENOUS
  Administered 2012-02-10: 1.37 mg via INTRAVENOUS
  Administered 2012-02-10: 7.5 mg via INTRAVENOUS
  Administered 2012-02-10: 12:00:00 via INTRAVENOUS
  Administered 2012-02-11: 4.5 mg via INTRAVENOUS
  Administered 2012-02-11: 6 mg via INTRAVENOUS
  Administered 2012-02-11: 4.5 mg via INTRAVENOUS
  Administered 2012-02-11: 3 mg via INTRAVENOUS
  Administered 2012-02-11: 19:00:00 via INTRAVENOUS
  Administered 2012-02-11: 3 mg via INTRAVENOUS
  Administered 2012-02-12: 5.3 mg via INTRAVENOUS
  Administered 2012-02-12: 1.5 mL via INTRAVENOUS
  Filled 2012-02-10 (×3): qty 25

## 2012-02-10 MED ORDER — ROCURONIUM BROMIDE 100 MG/10ML IV SOLN
INTRAVENOUS | Status: DC | PRN
  Administered 2012-02-10 (×2): 10 mg via INTRAVENOUS
  Administered 2012-02-10: 40 mg via INTRAVENOUS

## 2012-02-10 MED ORDER — DEXTROSE 5 % IV SOLN
1.0000 g | Freq: Four times a day (QID) | INTRAVENOUS | Status: AC
  Administered 2012-02-10: 1 g via INTRAVENOUS
  Filled 2012-02-10: qty 1

## 2012-02-10 MED ORDER — ALVIMOPAN 12 MG PO CAPS
ORAL_CAPSULE | ORAL | Status: AC
  Filled 2012-02-10: qty 1

## 2012-02-10 MED ORDER — CEFOXITIN SODIUM-DEXTROSE 1-4 GM-% IV SOLR (PREMIX)
INTRAVENOUS | Status: AC
  Filled 2012-02-10: qty 100

## 2012-02-10 MED ORDER — SODIUM CHLORIDE 0.9 % IJ SOLN: 9.0000 mL | INTRAMUSCULAR | Status: DC | PRN

## 2012-02-10 MED ORDER — GLYCOPYRROLATE 0.2 MG/ML IJ SOLN
INTRAMUSCULAR | Status: DC | PRN
  Administered 2012-02-10: 0.1 mg via INTRAVENOUS
  Administered 2012-02-10: .8 mg via INTRAVENOUS

## 2012-02-10 MED ORDER — ONDANSETRON HCL 4 MG PO TABS: 4.0000 mg | ORAL_TABLET | Freq: Four times a day (QID) | ORAL | Status: DC | PRN

## 2012-02-10 MED ORDER — HYDROCODONE-ACETAMINOPHEN 5-325 MG PO TABS
1.0000 | ORAL_TABLET | ORAL | Status: DC | PRN
  Administered 2012-02-13 – 2012-02-14 (×3): 2 via ORAL
  Filled 2012-02-10 (×3): qty 2

## 2012-02-10 MED ORDER — DEXAMETHASONE SODIUM PHOSPHATE 10 MG/ML IJ SOLN
INTRAMUSCULAR | Status: DC | PRN
  Administered 2012-02-10 (×2): 10 mg via INTRAVENOUS

## 2012-02-10 MED ORDER — NEOSTIGMINE METHYLSULFATE 1 MG/ML IJ SOLN
INTRAMUSCULAR | Status: DC | PRN
  Administered 2012-02-10: 5 mg via INTRAVENOUS

## 2012-02-10 MED ORDER — ALVIMOPAN 12 MG PO CAPS
12.0000 mg | ORAL_CAPSULE | Freq: Two times a day (BID) | ORAL | Status: DC
  Administered 2012-02-11 – 2012-02-14 (×7): 12 mg via ORAL
  Filled 2012-02-10 (×8): qty 1

## 2012-02-10 MED ORDER — DIPHENHYDRAMINE HCL 12.5 MG/5ML PO ELIX: 12.5000 mg | ORAL_SOLUTION | Freq: Four times a day (QID) | ORAL | Status: DC | PRN

## 2012-02-10 MED ORDER — HEPARIN SODIUM (PORCINE) 5000 UNIT/ML IJ SOLN
5000.0000 [IU] | Freq: Three times a day (TID) | INTRAMUSCULAR | Status: DC
  Administered 2012-02-10 – 2012-02-14 (×11): 5000 [IU] via SUBCUTANEOUS
  Filled 2012-02-10 (×14): qty 1

## 2012-02-10 MED ORDER — BUPIVACAINE LIPOSOME 1.3 % IJ SUSP
INTRAMUSCULAR | Status: DC | PRN
  Administered 2012-02-10: 20 mL

## 2012-02-10 MED ORDER — DIPHENHYDRAMINE HCL 50 MG/ML IJ SOLN: 12.5000 mg | Freq: Four times a day (QID) | INTRAMUSCULAR | Status: DC | PRN

## 2012-02-10 MED ORDER — ACETAMINOPHEN 10 MG/ML IV SOLN
INTRAVENOUS | Status: AC
  Filled 2012-02-10: qty 100

## 2012-02-10 MED ORDER — ONDANSETRON HCL 4 MG/2ML IJ SOLN
4.0000 mg | Freq: Four times a day (QID) | INTRAMUSCULAR | Status: DC | PRN
  Administered 2012-02-10: 4 mg via INTRAVENOUS
  Filled 2012-02-10: qty 2

## 2012-02-10 MED ORDER — PROPOFOL 10 MG/ML IV EMUL
INTRAVENOUS | Status: DC | PRN
  Administered 2012-02-10: 200 mg via INTRAVENOUS

## 2012-02-10 MED ORDER — BUPIVACAINE LIPOSOME 1.3 % IJ SUSP
20.0000 mL | Freq: Once | INTRAMUSCULAR | Status: DC
  Filled 2012-02-10: qty 20

## 2012-02-10 MED ORDER — ONDANSETRON HCL 4 MG/2ML IJ SOLN: 4.0000 mg | Freq: Four times a day (QID) | INTRAMUSCULAR | Status: DC | PRN

## 2012-02-10 MED ORDER — ONDANSETRON HCL 4 MG/2ML IJ SOLN
INTRAMUSCULAR | Status: DC | PRN
  Administered 2012-02-10: 4 mg via INTRAVENOUS

## 2012-02-10 MED ORDER — HEPARIN SODIUM (PORCINE) 5000 UNIT/ML IJ SOLN
INTRAMUSCULAR | Status: AC
  Filled 2012-02-10: qty 1

## 2012-02-10 SURGICAL SUPPLY — 80 items
APPLIER CLIP 5 13 M/L LIGAMAX5 (MISCELLANEOUS)
APPLIER CLIP ROT 10 11.4 M/L (STAPLE)
APR CLP MED LRG 11.4X10 (STAPLE)
APR CLP MED LRG 5 ANG JAW (MISCELLANEOUS)
BLADE EXTENDED COATED 6.5IN (ELECTRODE) ×1 IMPLANT
BLADE HEX COATED 2.75 (ELECTRODE) ×3 IMPLANT
BLADE SURG SZ10 CARB STEEL (BLADE) ×1 IMPLANT
CANISTER SUCTION 2500CC (MISCELLANEOUS) ×3 IMPLANT
CELLS DAT CNTRL 66122 CELL SVR (MISCELLANEOUS) IMPLANT
CLAMP ENDO BABCK 10MM (STAPLE) IMPLANT
CLIP APPLIE 5 13 M/L LIGAMAX5 (MISCELLANEOUS) IMPLANT
CLIP APPLIE ROT 10 11.4 M/L (STAPLE) IMPLANT
CLOTH BEACON ORANGE TIMEOUT ST (SAFETY) ×3 IMPLANT
CONNECTOR 5 IN 1 STRAIGHT STRL (MISCELLANEOUS) IMPLANT
COVER MAYO STAND STRL (DRAPES) ×3 IMPLANT
COVER SURGICAL LIGHT HANDLE (MISCELLANEOUS) ×3 IMPLANT
DECANTER SPIKE VIAL GLASS SM (MISCELLANEOUS) ×2 IMPLANT
DEVICE TROCAR PUNCTURE CLOSURE (ENDOMECHANICALS) IMPLANT
DRAPE LAPAROSCOPIC ABDOMINAL (DRAPES) ×3 IMPLANT
DRAPE LG THREE QUARTER DISP (DRAPES) IMPLANT
DRAPE WARM FLUID 44X44 (DRAPE) ×3 IMPLANT
DRESSING TELFA 8X3 (GAUZE/BANDAGES/DRESSINGS) ×2 IMPLANT
ELECT REM PT RETURN 9FT ADLT (ELECTROSURGICAL) ×3
ELECTRODE REM PT RTRN 9FT ADLT (ELECTROSURGICAL) ×2 IMPLANT
ENSEAL DEVICE STD TIP 35CM (ENDOMECHANICALS) IMPLANT
GLOVE BIOGEL PI IND STRL 7.0 (GLOVE) ×2 IMPLANT
GLOVE BIOGEL PI INDICATOR 7.0 (GLOVE) ×1
GOWN STRL NON-REIN LRG LVL3 (GOWN DISPOSABLE) ×4 IMPLANT
GOWN STRL REIN XL XLG (GOWN DISPOSABLE) ×6 IMPLANT
HAND ACTIVATED (MISCELLANEOUS) ×3 IMPLANT
KIT BASIN OR (CUSTOM PROCEDURE TRAY) ×3 IMPLANT
LEGGING LITHOTOMY PAIR STRL (DRAPES) IMPLANT
LIGASURE IMPACT 36 18CM CVD LR (INSTRUMENTS) ×2 IMPLANT
NS IRRIG 1000ML POUR BTL (IV SOLUTION) ×6 IMPLANT
PENCIL BUTTON HOLSTER BLD 10FT (ELECTRODE) ×3 IMPLANT
RELOAD PROXIMATE 75MM BLUE (ENDOMECHANICALS) ×6 IMPLANT
RELOAD STAPLE 75 3.8 BLU REG (ENDOMECHANICALS) IMPLANT
RETRACTOR WND ALEXIS 18 MED (MISCELLANEOUS) IMPLANT
RTRCTR WOUND ALEXIS 18CM MED (MISCELLANEOUS)
SCISSORS LAP 5X35 DISP (ENDOMECHANICALS) ×1 IMPLANT
SET IRRIG TUBING LAPAROSCOPIC (IRRIGATION / IRRIGATOR) ×1 IMPLANT
SLEEVE Z-THREAD 5X100MM (TROCAR) IMPLANT
SOLUTION ANTI FOG 6CC (MISCELLANEOUS) ×3 IMPLANT
SPONGE GAUZE 4X4 12PLY (GAUZE/BANDAGES/DRESSINGS) ×2 IMPLANT
SPONGE LAP 18X18 X RAY DECT (DISPOSABLE) ×5 IMPLANT
STAPLER PROXIMATE 75MM BLUE (STAPLE) ×1 IMPLANT
STAPLER VISISTAT 35W (STAPLE) ×3 IMPLANT
STRIP CLOSURE SKIN 1/2X4 (GAUZE/BANDAGES/DRESSINGS) IMPLANT
SUCTION POOLE TIP (SUCTIONS) ×3 IMPLANT
SUT PDS AB 1 CT1 27 (SUTURE) ×1 IMPLANT
SUT PDS AB 1 CTX 36 (SUTURE) ×2 IMPLANT
SUT PDS AB 4-0 SH 27 (SUTURE) IMPLANT
SUT PROLENE 2 0 KS (SUTURE) IMPLANT
SUT SILK 2 0 (SUTURE) ×3
SUT SILK 2 0 SH CR/8 (SUTURE) ×3 IMPLANT
SUT SILK 2-0 18XBRD TIE 12 (SUTURE) ×2 IMPLANT
SUT SILK 3 0 (SUTURE) ×3
SUT SILK 3 0 SH CR/8 (SUTURE) ×5 IMPLANT
SUT SILK 3-0 18XBRD TIE 12 (SUTURE) ×2 IMPLANT
SUT VIC AB 2-0 CT1 27 (SUTURE)
SUT VIC AB 2-0 CT1 27XBRD (SUTURE) IMPLANT
SUT VIC AB 3-0 PS2 18 (SUTURE)
SUT VIC AB 3-0 PS2 18XBRD (SUTURE) IMPLANT
SUT VIC AB 4-0 SH 18 (SUTURE) IMPLANT
SUT VICRYL 0 UR6 27IN ABS (SUTURE) IMPLANT
SYR 30ML LL (SYRINGE) ×3 IMPLANT
SYR BULB IRRIGATION 50ML (SYRINGE) ×3 IMPLANT
TAPE CLOTH SURG 4X10 WHT LF (GAUZE/BANDAGES/DRESSINGS) ×1 IMPLANT
TOWEL OR 17X26 10 PK STRL BLUE (TOWEL DISPOSABLE) ×4 IMPLANT
TRAY FOLEY CATH 14FRSI W/METER (CATHETERS) ×3 IMPLANT
TRAY LAP CHOLE (CUSTOM PROCEDURE TRAY) ×3 IMPLANT
TROCAR BLADELESS OPT 5 100 (ENDOMECHANICALS) ×1 IMPLANT
TROCAR HASSON GELL 12X100 (TROCAR) IMPLANT
TROCAR Z-THREAD FIOS 11X100 BL (TROCAR) IMPLANT
TROCAR Z-THREAD FIOS 5X100MM (TROCAR) ×1 IMPLANT
TROCAR Z-THREAD SLEEVE 11X100 (TROCAR) IMPLANT
TUBING CONNECTING 10 (TUBING) IMPLANT
TUBING FILTER THERMOFLATOR (ELECTROSURGICAL) ×3 IMPLANT
YANKAUER SUCT BULB TIP 10FT TU (MISCELLANEOUS) ×3 IMPLANT
YANKAUER SUCT BULB TIP NO VENT (SUCTIONS) ×3 IMPLANT

## 2012-02-10 NOTE — Transfer of Care (Signed)
Immediate Anesthesia Transfer of Care Note  Patient: Casey [name]  Procedure(s) Performed: Procedure(s) (LRB): LAPAROSCOPIC COLOSTOMY TAKEDOWN (N/A) ILEOSTOMY TAKEDOWN ()  Patient Location: PACU  Anesthesia Type: General  Level of Consciousness: sedated, patient cooperative and responds to stimulaton  Airway & Oxygen Therapy: Patient Spontanous Breathing and Patient connected to face mask oxgen  Post-op Assessment: Report given to PACU RN and Post -op Vital signs reviewed and stable  Post vital signs: Reviewed and stable  Complications: No apparent anesthesia complications

## 2012-02-10 NOTE — H&P (View-Only) (Signed)
ASSESSMENT AND PLAN: 1. Rectal Cancer.   12 cm from anal verge.   Final path - invasive adenocarcinoma,  pyT1, N0, M0  (Surgery 09/01/2011)  Tumor regression - Grade 1 of III  (Stage III by ractal ultrasound, preoperative)   Completed neoadjuvant chemo and radiation therapy 07/27/2011. Chemotx supervised by Dr. Leonard Schwartz. [name]. Radiation therapy supervised by Dr. Augustine Radar.   GI doctor - Dr. Shela Commons. [name].  Patient has undergone post op treatment with Xeloda.  And just finished Xeloda on 01/13/2011.    I have written him a note to be out of work until 02/26/2011 (after the ostomy is reversed.). We'll adjust that depending on the surgery.   2.  Protective loop ileostomy,  Has completed Xeloda.  Will schedule reversal of ileostomy in 3 to 4 weeks.  Discussed indication for surgery and the risks.  Risks included, but are not limited to, bleeding, infection, and leak.   3. Quit smoking in July 2012.   4. Atypical chest pain. Negative stress test.  He saw Dr. Shela Commons. [name] 08/26/11.  HISTORY OF PRESENT ILLNESS: Chief Complaint  Patient presents with  . Follow-up    Discuss ostomy takedown    Casey [name] is a 51 y.o. (DOB: 17-Nov-1961)  white male who is a patient of MEYERS,STEPHEN C, MD, MD and comes to me today for follow up of rectal resection.  Mr. Baines had a LAR on 09/01/2011 for a rectal cancer at 10-12 cm from anal verge.  He has a protective ileostomy.  He has done well, though still has some tenderness.  He comes accompanied with his wife.  He has finished the  Xeloda, and is ready tol schedule his ostomy reversal.  I reviewed with him the surgery and answered questions.  PHYSICAL EXAM: BP 138/90  Pulse 78  Temp(Src) 97.4 F (36.3 C) (Temporal)  Resp 18  Ht 5\' 11"  (1.803 m)  Wt 196 lb 6 oz (89.075 kg)  BMI 27.39 kg/m2  General: WNWM who is alert and generally healthy appearing.  HEENT: Normal. Pupils equal. Good dentition. Lungs: Clear to auscultation and symmetric breath  sounds. Heart:  RRR. No murmur or rub.  Abdomen: Soft. No mass. No tenderness.  Normal bowel sounds. Lower midline wound okay.  Ileostomy RLQ okay.   Extemities:  Good strength and ROM  in upper and lower extremities.  DATA REVIEWED: BE on 12/21/2010 - negative.

## 2012-02-10 NOTE — Interval H&P Note (Signed)
History and Physical Interval Note:  02/10/2012 9:26 AM  Casey [name]  has presented today for surgery, with the diagnosis of ileostomy  The various methods of treatment have been discussed with the patient and family.   After consideration of risks, benefits and other options for treatment, the patient has consented to  Procedure(s) (LRB): LAPAROSCOPIC COLOSTOMY TAKEDOWN (N/A) as a surgical intervention .    The patients' history has been reviewed, patient examined, no change in status, stable for surgery.  I have reviewed the patients' chart and labs.  Questions were answered to the patient's satisfaction.     Cher Egnor,Kiptyn H

## 2012-02-10 NOTE — Anesthesia Postprocedure Evaluation (Signed)
  Anesthesia Post-op Note  Patient: Casey [name]  Procedure(s) Performed: Procedure(s) (LRB): LAPAROSCOPIC COLOSTOMY TAKEDOWN (N/A) ILEOSTOMY TAKEDOWN ()  Patient Location: PACU  Anesthesia Type: General  Level of Consciousness: oriented and sedated  Airway and Oxygen Therapy: Patient Spontanous Breathing and Patient connected to nasal cannula oxygen  Post-op Pain: mild  Post-op Assessment: Post-op Vital signs reviewed, Patient's Cardiovascular Status Stable, Respiratory Function Stable and Patent Airway  Post-op Vital Signs: stable  Complications: No apparent anesthesia complications

## 2012-02-10 NOTE — Brief Op Note (Signed)
02/10/2012  11:49 AM  PATIENT:  Casey [name], 51 y.o., male, MRN: [id number]  PREOP DIAGNOSIS:  ileostomy  POSTOP DIAGNOSIS:   Rectal cancer (T1, N0), diverting loop ileostomy  PROCEDURE:   Procedure(s):  LAPAROSCOPIC assisted ILEOSTOMY TAKEDOWN  SURGEON:   Ovidio Kin, M.D.  ASSISTANT:   none  ANESTHESIA:   general  Jill Side, MD - Anesthesiologist Mechele Dawley, CRNA - CRNA Donn Pierini, CRNA - CRNA  General  EBL:  minimal  ml  BLOOD ADMINISTERED: none  DRAINS: none   LOCAL MEDICATIONS USED:   20 cc Experel  SPECIMEN:   None  COUNTS CORRECT:  YES  INDICATIONS FOR PROCEDURE:  Casey [name] is a 51 y.o. (DOB: 1961-08-08) white male whose primary care physician is Cynda Familia, MD, MD and comes for reversal of loop ileostomy.  Patient had a LAR on 09/01/2011.   The indications and risks of the surgery were explained to the patient.  The risks include, but are not limited to, infection, bleeding, and nerve injury.  Note dictated to:   #[id number]

## 2012-02-10 NOTE — Progress Notes (Signed)
Pt has been educated regarding correct use of Incentive Spirometer (IS). Pt verbalizes understanding of correct use. Pt states that there is no further questions regarding correct use of the IS.Savien Mamula L, RN    

## 2012-02-10 NOTE — Anesthesia Preprocedure Evaluation (Signed)
Anesthesia Evaluation  Patient identified by MRN, date of birth, ID band Patient awake    Reviewed: Allergy & Precautions, H&P , NPO status , Patient's Chart, lab work & pertinent test results, reviewed documented beta blocker date and time   Airway Mallampati: II TM Distance: >3 FB Neck ROM: Full    Dental  (+) Teeth Intact   Pulmonary former smoker breath sounds clear to auscultation        Cardiovascular hypertension, Rhythm:Regular Rate:Normal  HTN, no Rx Denies cardiac symptoms   Neuro/Psych negative neurological ROS  negative psych ROS   GI/Hepatic Neg liver ROS, Ileostomy takedown Hx colon cancer   Endo/Other  negative endocrine ROS  Renal/GU negative Renal ROS  negative genitourinary   Musculoskeletal negative musculoskeletal ROS (+)   Abdominal   Peds negative pediatric ROS (+)  Hematology negative hematology ROS (+)   Anesthesia Other Findings   Reproductive/Obstetrics negative OB ROS                           Anesthesia Physical Anesthesia Plan  ASA: II  Anesthesia Plan: General   Post-op Pain Management:    Induction: Intravenous  Airway Management Planned: Oral ETT  Additional Equipment:   Intra-op Plan:   Post-operative Plan: Extubation in OR  Informed Consent: I have reviewed the patients History and Physical, chart, labs and discussed the procedure including the risks, benefits and alternatives for the proposed anesthesia with the patient or authorized representative who has indicated his/her understanding and acceptance.   Dental advisory given  Plan Discussed with: CRNA and Surgeon  Anesthesia Plan Comments:         Anesthesia Quick Evaluation

## 2012-02-11 LAB — CBC
HCT: 39 % (ref 39.0–52.0)
Hemoglobin: 13.3 g/dL (ref 13.0–17.0)
MCH: 33.8 pg (ref 26.0–34.0)
MCHC: 34.1 g/dL (ref 30.0–36.0)
MCV: 99.2 fL (ref 78.0–100.0)
Platelets: 255 10*3/uL (ref 150–400)
RBC: 3.93 MIL/uL — ABNORMAL LOW (ref 4.22–5.81)
RDW: 15.8 % — ABNORMAL HIGH (ref 11.5–15.5)
WBC: 12.2 10*3/uL — ABNORMAL HIGH (ref 4.0–10.5)

## 2012-02-11 LAB — BASIC METABOLIC PANEL
BUN: 11 mg/dL (ref 6–23)
CO2: 27 mEq/L (ref 19–32)
Calcium: 8.8 mg/dL (ref 8.4–10.5)
Chloride: 104 mEq/L (ref 96–112)
Creatinine, Ser: 1.08 mg/dL (ref 0.50–1.35)
GFR calc Af Amer: 89 mL/min — ABNORMAL LOW (ref >90)
GFR calc non Af Amer: 77 mL/min — ABNORMAL LOW (ref >90)
Glucose, Bld: 130 mg/dL — ABNORMAL HIGH (ref 70–99)
Potassium: 4.3 mEq/L (ref 3.5–5.1)
Sodium: 138 mEq/L (ref 135–145)

## 2012-02-11 NOTE — Op Note (Signed)
NAMEDEFOREST, MAIDEN NO.:  [id number]  MEDICAL RECORD NO.:  [id number]  LOCATION:  1522                         FACILITY:  [facility]  PHYSICIAN:  Sandria Bales. Ezzard Standing, M.D.  DATE OF BIRTH:  June 09, 1961  DATE OF PROCEDURE:  02/10/2012                              OPERATIVE REPORT  PREOPERATIVE DIAGNOSIS:  Rectal cancer (T1, N0), diverting loop ileostomy.  POSTOPERATIVE DIAGNOSIS:  Rectal cancer (T1, N0), diverting loop ileostomy.  PROCEDURE:  Laparoscopic-assisted ileostomy take down.  SURGEON:  Sandria Bales. Ezzard Standing, M.D.  ASSISTANT:  None.  ANESTHESIA:  Local anesthetic with 20 cc of Exparel.  ESTIMATED BLOOD LOSS:  Minimal.  COMPLICATIONS:  None.  INDICATIONS FOR PROCEDURE:  Mr. Daywalt is a 51 year old white male who sees [name] who is his medical doctor.  He was found to have a rectal cancer by Dr. Sharrell Ku.  He underwent neoadjuvant chemoradiation by [name] and [name].  I did a low anterior resection on him on September 01, 2011, and did a protective loop ileostomy.  He completed postop chemotherapy by [name], he has now completed this by about a month; and was on postop Xeloda for about a month and now comes for reversal of his ileostomy.  The indications and potential complications of surgery were explained to the patient.  Potential complications include, but are not limited to, bleeding, infection, leakage from the bowel, and hernia.  OPERATIVE NOTE:  With the patient in a supine position with his left arm tucked and right arm out to his side he had a Foley catheter in place, and OG tube in place, he had anesthesia supervised by Dr. Lestine Box in room #11 at [facility].  A time-out was held and a surgical checklist run.  He was given cefoxitin at the initiation of the procedure.  His ileostomy was closed.  He was shaved, prepped with ChloraPrep and then sterilely dressed.  I accessed the  abdominal cavity through the left upper quadrant using a 5-mm Optiview trocar.  I then insufflated the abdomen and looked around, I saw no nodules in his liver, stomach, or his bowel.  He did have omentum stuck to the under surface of his prior midline incision, which I took down without difficulty.  There was no evidence of hernia that I could see where his ostomy was in the right lower quadrant with really minimal adhesions.  I placed a second trocar in the left lower quadrant.  After doing the exploration and finding no evidence of metastatic disease and his bowel pretty adhesion-free at least to the anterior abdominal wall I externally excised the outer edge of the ostomy, took this dissection down to the fascial defect.  I was able to free the bowel enough to get about 6 cm to 8 cm of the small bowel out of the abdominal cavity.  I did remove the previous ileostomy and divided the bowel with two 70 Ethicon GIA staplers.  The bowel was viable.  The mesentery  looked good.  I then did a side-to-side stapled anastomosis.  I only used the stapler for about 3 cm.  This provided a good two fingerbreadth anastomosis.  I placed a stitch at the crotch.  I then closed my enterotomy with interrupted 2-0 silk sutures.  There was no bleeding along the staple line, and again the bowel looked viable.    The anastomosis was dropped back into the abdominal cavity.  I closed the fascia with the peritoneum posteriorly with two running #1 PDS sutures, the anterior fascia with 2 running #1 PDS sutures.  I irrigated the right groin wound with saline.  I then re-laparoscoped the patient to make sure there was no bowel involved.  I used Exparel with local anesthetic to infiltrate both the superficial and deep preperitoneal spaces in the skin.  I then irrigated the wound again and closed the skin with staples.  I placed a single Telfa wick in the wound, stapled the other wounds.  He tolerated the  procedure well.  The sponge and needle counts were correct at the end of the case.  He was transferred to recovery room in good condition.   Sandria Bales. Ezzard Standing, M.D., FACS   DHN/MEDQ  D:  02/10/2012  T:  02/11/2012  Job:  [id number]  cc:   Joycelyn Rua, M.D. Fax: [phone]  Radene Gunning, M.D., Ph.D. Fax: [phone]  Griffith Citron, M.D. Fax: [phone]  Rollene Rotunda, MD, [facility]  Ladene Artist, M.D. Fax: [phone]

## 2012-02-11 NOTE — Progress Notes (Signed)
1 Day Post-Op  Subjective: Doing well. Pain well controlled.  Objective: Vital signs in last 24 hours: Temp:  [97.4 F (36.3 C)-98.9 F (37.2 C)] 98.6 F (37 C) (03/16 0500) Pulse Rate:  [68-89] 85  (03/16 0500) Resp:  [11-20] 16  (03/16 0800) BP: (119-166)/(65-98) 133/71 mmHg (03/16 0500) SpO2:  [93 %-100 %] 98 % (03/16 0800) Weight:  [202 lb 7 oz (91.825 kg)] 202 lb 7 oz (91.825 kg) (03/15 1530)    Intake/Output from previous day: 03/15 0701 - 03/16 0700 In: 3403 [I.V.:3403] Out: 2000 [Urine:1975; Blood:25] Intake/Output this shift: Total I/O In: -  Out: 900 [Urine:900]  General appearance: alert, cooperative and no distress Resp: clear to auscultation bilaterally Cardio: regular rate and rhythm, S1, S2 normal, no murmur, click, rub or gallop GI: soft, minimal tenderness around ostomy site but otherwise minimal discomfort, active BS, wicks in wound, trocars okay  Lab Results:   [facility] 02/11/12 0450  WBC 12.2*  HGB 13.3  HCT 39.0  PLT 255   BMET  Basename 02/11/12 0450  NA 138  K 4.3  CL 104  CO2 27  GLUCOSE 130*  BUN 11  CREATININE 1.08  CALCIUM 8.8   PT/INR No results found for this basename: LABPROT:2,INR:2 in the last 72 hours ABG No results found for this basename: PHART:2,PCO2:2,PO2:2,HCO3:2 in the last 72 hours  Studies/Results: No results found.  Anti-infectives: Anti-infectives     Start     Dose/Rate Route Frequency Ordered Stop   02/10/12 1600   cefOXitin (MEFOXIN) 1 g in dextrose 5 % 50 mL IVPB        1 g 100 mL/hr over 30 Minutes Intravenous 4 times per day 02/10/12 1312 02/10/12 1614   02/10/12 0800   cefOXitin (MEFOXIN) 2 g in dextrose 5 % 50 mL IVPB        2 g 100 mL/hr over 30 Minutes Intravenous  Once 02/10/12 0734 02/10/12 1006          Assessment/Plan: s/p Procedure(s) (LRB): LAPAROSCOPIC COLOSTOMY TAKEDOWN (N/A) ILEOSTOMY TAKEDOWN () Doing great,  will give some sips today but will likely start advancing diet  tomorrow if tolerating sips.  LOS: 1 day    Lodema Pilot Darsh 02/11/2012

## 2012-02-12 MED ORDER — MORPHINE SULFATE 2 MG/ML IJ SOLN
1.0000 mg | INTRAMUSCULAR | Status: DC | PRN
  Administered 2012-02-12 – 2012-02-13 (×5): 2 mg via INTRAVENOUS
  Filled 2012-02-12 (×5): qty 1

## 2012-02-12 NOTE — Progress Notes (Signed)
2 Days Post-Op  Subjective: Pain improving daily. No complaints.  No bowel movement or flatus  Objective: Vital signs in last 24 hours: Temp:  [97.7 F (36.5 C)-99.6 F (37.6 C)] 98.8 F (37.1 C) (03/17 0448) Pulse Rate:  [66-89] 75  (03/17 0448) Resp:  [10-20] 10  (03/17 0448) BP: (118-157)/(58-96) 118/58 mmHg (03/17 0448) SpO2:  [89 %-100 %] 98 % (03/17 0448) Last BM Date: 02/09/12 (with ileostomy)  Intake/Output from previous day: 03/16 0701 - 03/17 0700 In: 4900.5 [I.V.:4900.5] Out: 4625 [Urine:4625] Intake/Output this shift:    General appearance: alert, cooperative and no distress Resp: clear to auscultation bilaterally Cardio: normal rate, regular rhythm GI: soft, minimal RLQ tenderness, wick removed, no erythema, +BS, no peritoneal signs  Lab Results:   Basename 02/11/12 0450  WBC 12.2*  HGB 13.3  HCT 39.0  PLT 255   BMET  Basename 02/11/12 0450  NA 138  K 4.3  CL 104  CO2 27  GLUCOSE 130*  BUN 11  CREATININE 1.08  CALCIUM 8.8   PT/INR No results found for this basename: LABPROT:2,INR:2 in the last 72 hours ABG No results found for this basename: PHART:2,PCO2:2,PO2:2,HCO3:2 in the last 72 hours  Studies/Results: No results found.  Anti-infectives: Anti-infectives     Start     Dose/Rate Route Frequency Ordered Stop   02/10/12 1600   cefOXitin (MEFOXIN) 1 g in dextrose 5 % 50 mL IVPB        1 g 100 mL/hr over 30 Minutes Intravenous 4 times per day 02/10/12 1312 02/10/12 1614   02/10/12 0800   cefOXitin (MEFOXIN) 2 g in dextrose 5 % 50 mL IVPB        2 g 100 mL/hr over 30 Minutes Intravenous  Once 02/10/12 0734 02/10/12 1006          Assessment/Plan: s/p Procedure(s) (LRB): LAPAROSCOPIC COLOSTOMY TAKEDOWN (N/A) ILEOSTOMY TAKEDOWN () Seems to be doing okay, will trial clears today and try to convert to oral meds  LOS: 2 days    Lodema Pilot Warnie 02/12/2012

## 2012-02-13 NOTE — Progress Notes (Signed)
General Surgery Note  LOS: 3 days  POD# 3  Assessment/Plan:   1.   LAPAROSCOPIC assisted ILEOSTOMY TAKEDOWN - 02/10/2012 - D. Tamea Bai  2.  Rectal Cancer.   12 cm from anal verge.   Final path - invasive adenocarcinoma, pyT1, N0, M0 (Surgery 09/01/2011)   Tumor regression - Grade 1 of III (Stage III by ractal ultrasound, preoperative)   Completed neoadjuvant chemo and radiation therapy 07/27/2011. Chemotx supervised by Dr. Leonard Schwartz. [name]. Radiation therapy supervised by Dr. Augustine Radar.  3.  History of smoking cigarettes.  Subjective:  Had a few small BMs, no nausea Objective:   Filed Vitals:   02/13/12 0435  BP: 116/74  Pulse: 71  Temp: 98.4 F (36.9 C)  Resp: 16     Intake/Output from previous day:  03/17 0701 - 03/18 0700 In: 2240 [P.O.:840; I.V.:1400] Out: 2125 [Urine:2125]  Intake/Output this shift:      Physical Exam:   General: WN M who is alert and oriented.    HEENT: Normal. Pupils equal. Good dentition. .   Lungs: Clear   Abdomen: mild distention   Wound: mild redness, probed with Q tip - negative.   Neurologic:  Grossly intact to motor and sensory function.   Psychiatric: Has normal mood and affect. Behavior is normal   Lab Results:    Basename 02/11/12 0450  WBC 12.2*  HGB 13.3  HCT 39.0  PLT 255    BMET   Basename 02/11/12 0450  NA 138  K 4.3  CL 104  CO2 27  GLUCOSE 130*  BUN 11  CREATININE 1.08  CALCIUM 8.8    PT/INR  No results found for this basename: LABPROT:2,INR:2 in the last 72 hours  ABG  No results found for this basename: PHART:2,PCO2:2,PO2:2,HCO3:2 in the last 72 hours   Studies/Results:  No results found.   Anti-infectives:   Anti-infectives     Start     Dose/Rate Route Frequency Ordered Stop   02/10/12 1600   cefOXitin (MEFOXIN) 1 g in dextrose 5 % 50 mL IVPB        1 g 100 mL/hr over 30 Minutes Intravenous 4 times per day 02/10/12 1312 02/10/12 1614   02/10/12 0800   cefOXitin (MEFOXIN) 2 g in dextrose 5 % 50 mL IVPB          2 g 100 mL/hr over 30 Minutes Intravenous  Once 02/10/12 0734 02/10/12 1006          Ovidio Kin, MD, FACS Pager: [phone],   [facility] Office: [phone] 02/13/2012

## 2012-02-14 NOTE — Progress Notes (Signed)
Patient given discharge instructions. Verbalized understanding of instructions. Iv site removed. Catheter tip intact. Patient ambulating independendly, tolerating diet, no c/o discomfort. Left unit in wheelchair accompanied by staff.

## 2012-02-14 NOTE — Discharge Instructions (Signed)
DISCHARGE INSTRUCTIONS TO PATIENT  Return to work on:  Will discuss at office follow up.  Activity:  Driving - May drive in 2 to 3 days if doing well.   Lifting - No lifting > 15 pounds for 2 weeks, then no limit.  Wound Care:   May shower at home.  Diet:  Regular.  Follow up appointment:  Call Dr. Allene Pyo office [facility]) at [phone] for an appointment in about 7 days.  Medications and dosages:  Resume your home medications.  You have a prescription for:  Has Vicodin at home.  Call Dr. Ezzard Standing or his office  [phone]) if you have:  Temperature greater than 100.4,  Persistent nausea and vomiting,  Severe uncontrolled pain,  Redness, tenderness, or signs of infection (pain, swelling, redness, odor or green/yellow discharge around the site),  Difficulty breathing, headache or visual disturbances,  Any other questions or concerns you may have after discharge.  In an emergency, call 911 or go to an Emergency Department at a nearby hospital.

## 2012-02-14 NOTE — Progress Notes (Signed)
General Surgery Note  LOS: 4 days  POD# 4  Assessment/Plan:   1.   LAPAROSCOPIC assisted ILEOSTOMY TAKEDOWN - 02/10/2012 - [name]  Doing well with diet - will advance to reg diet and if tolerated, he will go home.  D/C instructions reviewed.  Patient already has pain meds.  D/C dictate - #[id number]  2.  Rectal Cancer.   12 cm from anal verge.   Final path - invasive adenocarcinoma, pyT1, N0, M0 (Surgery 09/01/2011)   Tumor regression - Grade 1 of III (Stage III by ractal ultrasound, preoperative)   Completed neoadjuvant chemo and radiation therapy 07/27/2011. Chemotx supervised by Dr. Leonard Schwartz. [name]. Radiation therapy supervised by Dr. Augustine Radar.  3.  History of smoking cigarettes - quit July 2012.  Subjective:  Tolerated full liquids. Better control with bowels. Objective:   Filed Vitals:   02/14/12 0513  BP: 113/69  Pulse: 68  Temp: 97.9 F (36.6 C)  Resp: 16     Intake/Output from previous day:  03/18 0701 - 03/19 0700 In: 1409.2 [P.O.:860; I.V.:549.2] Out: 800 [Urine:800]  Intake/Output this shift:      Physical Exam:   General: WN M who is alert and oriented.    HEENT: Normal. Pupils equal. Good dentition. .   Lungs: Clear   Abdomen: soft, BS present   Wound: less redness to wound.   Neurologic:  Grossly intact to motor and sensory function.   Psychiatric: Has normal mood and affect. Behavior is normal   Lab Results:   No results found for this basename: WBC:2,HGB:2,HCT:2,PLT:2 in the last 72 hours  BMET  No results found for this basename: NA:2,K:2,CL:2,CO2:2,GLUCOSE:2,BUN:2,CREATININE:2,CALCIUM:2 in the last 72 hours  PT/INR  No results found for this basename: LABPROT:2,INR:2 in the last 72 hours  ABG  No results found for this basename: PHART:2,PCO2:2,PO2:2,HCO3:2 in the last 72 hours   Studies/Results:  No results found.   Anti-infectives:   Anti-infectives     Start     Dose/Rate Route Frequency Ordered Stop   02/10/12 1600   cefOXitin (MEFOXIN) 1 g in  dextrose 5 % 50 mL IVPB        1 g 100 mL/hr over 30 Minutes Intravenous 4 times per day 02/10/12 1312 02/10/12 1614   02/10/12 0800   cefOXitin (MEFOXIN) 2 g in dextrose 5 % 50 mL IVPB        2 g 100 mL/hr over 30 Minutes Intravenous  Once 02/10/12 0734 02/10/12 1006          Casey Kin, MD, FACS Pager: [phone],   [facility] Office: [phone] 02/14/2012

## 2012-02-15 NOTE — Discharge Summary (Signed)
Casey [name], Casey [name]  MEDICAL RECORD NO.:  [id number]  LOCATION:  1522                         FACILITY:  [facility]  PHYSICIAN:  Sandria Bales. Ezzard Standing, M.D.  DATE OF BIRTH:  03-22-1961  DATE OF ADMISSION:  02/10/2012 DATE OF DISCHARGE:  02/14/2012                              DISCHARGE SUMMARY   This is an unedited report.  For final edited dictation, please contact [facility]/Liz Katrinka Blazing.  DISCHARGE DIAGNOSES: 1. Takedown of protective loop ileostomy. 2. Rectal cancer approximately 12 cm from anal verge (Py T1, Npy N0)     with surgical resection on September 01, 2011. 3. History of smoking cigarettes.  OPERATIONS PERFORMED:  The patient underwent a laparoscopic-assisted ileostomy takedown on February 10, 2012 by Dr. Ovidio Kin.  HISTORY OF PRESENT ILLNESS:  Casey [name] is a 51 year old white male who sees [name].  He was found to have a rectal carcinoma by Dr. Tressa Busman in August, 2012.  He underwent neoadjuvant chemo, radiation therapy supervised by Drs. Mancel Bale and Dorothy Puffer and I resected his rectum on September 01, 2011.  He had a good tumor response with the final pathology showing a PyT1, TyN0 tumor.  He then completed Xeloda treatment by [name] and the outcomes for reversal of a protective ileostomy placed at the time of his rectal resection.  He has done well with all his treatments.  His major other comorbid problems, he quit smoking in July, 2012.  He had some atypical chest pain, seen by [name] in September, 2012 and had a negative stress test at that time.  HOSPITAL COURSE:  The patient was taken to the operating room on the day of admission where he underwent a laparoscopic-assisted ileostomy reversal.  Postoperatively he did well.  He was started on liquids on the second postoperative day, and slowly advanced now to a regular diet. He will try regular food today before he goes home and  should tolerate this well.  He has been on Entereg, he has been on subcu heparin for DVT prophylaxis.  He has had no nausea or vomiting.  He is now ready for discharge.   His discharge instructions include a regular diet.   He can shower.   He should not drive for 3 or 4 days, but after that should be fine driving.   He is to see me back in 1 week.   He already has some Vicodin at home  given him from his previous surgery and abdominal pain. He thinks this would be adequate to cover him, until he gets to see me back in a week.  DISCHARGE CONDITION:  Good.   Sandria Bales. Ezzard Standing, M.D., FACS   DHN/MEDQ  D:  02/14/2012  T:  02/15/2012  Job:  [id number]  cc:   Ladene Artist, M.D. Fax: [phone]  Radene Gunning, M.D., Ph.D. Fax: [phone]  Griffith Citron, M.D. Fax: [phone]  Joycelyn Rua, M.D. Fax: [phone]

## 2012-02-22 ENCOUNTER — Ambulatory Visit (INDEPENDENT_AMBULATORY_CARE_PROVIDER_SITE_OTHER): Payer: 59 | Admitting: Surgery

## 2012-02-22 ENCOUNTER — Encounter (INDEPENDENT_AMBULATORY_CARE_PROVIDER_SITE_OTHER): Payer: Self-pay | Admitting: Surgery

## 2012-02-22 VITALS — BP 120/90 | HR 72 | Temp 98.0°F | Resp 18 | Ht 71.0 in | Wt 197.5 lb

## 2012-02-22 DIAGNOSIS — C20 Malignant neoplasm of rectum: Secondary | ICD-10-CM

## 2012-02-22 NOTE — Progress Notes (Signed)
ASSESSMENT AND PLAN: 1. Rectal Cancer.   12 cm from anal verge.   Final path - invasive adenocarcinoma,  pyT1, N0, M0  (Surgery 09/01/2011)  Tumor regression - Grade 1 of III  (Stage III by ractal ultrasound, preoperative)   Completed neoadjuvant chemo and radiation therapy 07/27/2011. Chemotx supervised by Dr. Leonard Schwartz. Casey [name]. Radiation therapy supervised by Dr. Augustine Radar.   GI doctor - Dr. Shela Commons. Casey [name].  He looks good.  I wrote him to be out of work until 04/16/2012.  I'll see him back in 6 months.   2.  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  Staples out.   3. Quit smoking in July 2012.  4. Atypical chest pain. Negative stress test.  He saw Dr. Shela Commons. Casey [name] 08/26/11.  HISTORY OF PRESENT ILLNESS: Chief Complaint  Patient presents with  . Routine Post Op    Casey [name] is a 51 y.o. (DOB: November 18, 1961)  white male who is a patient of MEYERS, STEPHEN, MD, MD and comes to me today for follow up of ileostomy reversal.  He is accompanied wit his wife.  Casey [name] had a LAR on 09/01/2011 for a rectal cancer at 10-12 cm from anal verge.  He had a protective ileostomy which I reversed 02/09/2012.   He is doing well, though his bowels are still adjusting.  He is eating well. I wrote him to be out of work until 04/16/2012.  PHYSICAL EXAM: BP 120/90  Pulse 72  Temp(Src) 98 F (36.7 C) (Temporal)  Resp 18  Ht 5\' 11"  (1.803 m)  Wt 197 lb 8 oz (89.585 kg)  BMI 27.55 kg/m2  General: WNWM who is alert and generally healthy appearing.   Abdomen: Soft. No mass. No tenderness.  Normal bowel sounds. Lower midline wound okay.  RLQ incision okay - staples out. Extemities:  Good strength and ROM  in upper and lower extremities.  DATA REVIEWED: None new.  Casey Kin, MD, [facility] Pager: [phone] Office phone:  [phone]

## 2012-03-08 ENCOUNTER — Encounter (INDEPENDENT_AMBULATORY_CARE_PROVIDER_SITE_OTHER): Payer: Self-pay | Admitting: Surgery

## 2012-03-14 ENCOUNTER — Other Ambulatory Visit (INDEPENDENT_AMBULATORY_CARE_PROVIDER_SITE_OTHER): Payer: Self-pay

## 2012-03-14 DIAGNOSIS — N529 Male erectile dysfunction, unspecified: Secondary | ICD-10-CM

## 2012-05-21 ENCOUNTER — Other Ambulatory Visit (HOSPITAL_BASED_OUTPATIENT_CLINIC_OR_DEPARTMENT_OTHER): Payer: 59 | Admitting: Lab

## 2012-05-21 DIAGNOSIS — C20 Malignant neoplasm of rectum: Secondary | ICD-10-CM

## 2012-05-21 LAB — CEA: CEA: <0.5 ng/mL (ref 0.0–5.0)

## 2012-05-21 LAB — COMPREHENSIVE METABOLIC PANEL
ALT: 11 U/L (ref 0–53)
AST: 14 U/L (ref 0–37)
Albumin: 4.1 g/dL (ref 3.5–5.2)
Alkaline Phosphatase: 50 U/L (ref 39–117)
BUN: 18 mg/dL (ref 6–23)
CO2: 28 mEq/L (ref 19–32)
Calcium: 9 mg/dL (ref 8.4–10.5)
Chloride: 105 mEq/L (ref 96–112)
Creatinine, Ser: 1.18 mg/dL (ref 0.50–1.35)
Glucose, Bld: 109 mg/dL — ABNORMAL HIGH (ref 70–99)
Potassium: 3.9 mEq/L (ref 3.5–5.3)
Sodium: 143 mEq/L (ref 135–145)
Total Bilirubin: 0.6 mg/dL (ref 0.3–1.2)
Total Protein: 6.4 g/dL (ref 6.0–8.3)

## 2012-05-28 ENCOUNTER — Other Ambulatory Visit (HOSPITAL_COMMUNITY): Payer: 59

## 2012-05-28 ENCOUNTER — Other Ambulatory Visit: Payer: Self-pay | Admitting: Oncology

## 2012-05-28 ENCOUNTER — Ambulatory Visit (HOSPITAL_COMMUNITY)
Admission: RE | Admit: 2012-05-28 | Discharge: 2012-05-28 | Disposition: A | Discharge status: Home / Self Care | Payer: 59 | Source: Ambulatory Visit | Attending: Oncology | Admitting: Oncology

## 2012-05-28 DIAGNOSIS — C20 Malignant neoplasm of rectum: Secondary | ICD-10-CM | POA: Insufficient documentation

## 2012-05-28 DIAGNOSIS — Z9221 Personal history of antineoplastic chemotherapy: Secondary | ICD-10-CM | POA: Insufficient documentation

## 2012-05-28 DIAGNOSIS — K8020 Calculus of gallbladder without cholecystitis without obstruction: Secondary | ICD-10-CM | POA: Insufficient documentation

## 2012-05-28 DIAGNOSIS — Z923 Personal history of irradiation: Secondary | ICD-10-CM | POA: Insufficient documentation

## 2012-05-28 MED ORDER — IOHEXOL 300 MG/ML  SOLN
100.0000 mL | Freq: Once | INTRAMUSCULAR | Status: AC | PRN
  Administered 2012-05-28: 100 mL via INTRAVENOUS

## 2012-05-29 ENCOUNTER — Ambulatory Visit (HOSPITAL_BASED_OUTPATIENT_CLINIC_OR_DEPARTMENT_OTHER): Payer: 59 | Admitting: Oncology

## 2012-05-29 ENCOUNTER — Telehealth: Payer: Self-pay | Admitting: Oncology

## 2012-05-29 VITALS — BP 126/85 | HR 78 | Temp 98.2°F | Ht 71.0 in | Wt 188.4 lb

## 2012-05-29 DIAGNOSIS — C20 Malignant neoplasm of rectum: Secondary | ICD-10-CM

## 2012-05-29 NOTE — Telephone Encounter (Signed)
appts made and printed for pt,called dr [name] and gave pt info-sec. There advised that she had the info and asked the pt to call her,gave the pt the name and number    aom

## 2012-05-29 NOTE — Progress Notes (Signed)
   [facility]    OFFICE PROGRESS NOTE   INTERVAL HISTORY:   He returns as scheduled. He has returned to work. He reports irregular bowel habits. No further chest pain or "hot "feeling. The erectile dysfunction has improved with Cialis.  Objective:  Vital signs in last 24 hours:  Blood pressure 126/85, pulse 78, temperature 98.2 F (36.8 C), temperature source Oral, height 5\' 11"  (1.803 m), weight 188 lb 6.4 oz (85.458 kg).    HEENT: Neck without mass Lymphatics: No cervical, supraclavicular, axillary, or inguinal nodes Resp: Good air movement bilaterally, end inspiratory bronchial sounds at the upper posterior chest bilaterally, no respiratory distress Cardio: Regular rate and rhythm GI: Nontender, no hepatosplenomegaly, no mass Vascular: The left lower leg is slightly larger than the right side, no edema   Lab Results: CEA on 05/21/2012 less than 0.5   X-rays: Restaging CTs of the chest, abdomen, and pelvis on 05/28/2012-no evidence of metastatic disease. Previously described tiny lesion in the liver dome is not well visualized. No focal hepatic lesion is seen.   Medications: I have reviewed the patient's current medications.  Assessment/Plan: 1. Rectal cancer, clinical stage III (uT3, uN1), status post an endoscopic biopsy on 05/30/2011.  a. Initiation of concurrent Xeloda and radiation on 06/20/2011, completed on 07/27/2011. b. Low anterior resection on 09/01/2011 with the pathology confirming a yPT1 N0 tumor. c. Initiation of adjuvant Xeloda chemotherapy with cycle #1 beginning 10/07/2011, cycle #2 10/29/2011, cycle #3 on 11/24/2011, Cycle #4 on 12/10/2011, cycle 5 on 12/31/2011. d. Laparoscopic-assisted ileostomy takedown on 02/10/2012 2. History of multiple colorectal polyps. 3. Allergic rhinitis. 4. History of "chest heaviness and arm-leg pain" with a "hot" feeling during the course of chemotherapy and radiation, status post a negative cardiac  evaluation by [name].  5. erectile dysfunction following rectal surgery/radiation-improved with Cialis  Disposition:  Mr. Yetta Numbers remains in clinical remission from rectal cancer. He will return for an office visit and CEA in 6 months. We will refer him to [name] for a surveillance colonoscopy.   Thornton Papas, MD  05/29/2012  2:38 PM

## 2012-06-21 ENCOUNTER — Encounter: Payer: Self-pay | Admitting: Dietician

## 2012-06-21 NOTE — Progress Notes (Signed)
Brief Out-patient Oncology Nutrition Note  Reason: Patient Screened Positive For Nutrition Risk For Unintentional Weight Loss and Decreased Appetite.   Casey [name] is a 51 year old male patient of Dr. Alcide Evener, diagnosed with rectal cancer. Attempted to contact patient via telephone for positive nutrition risk. Left patient voice mail with RD contact information.   Wt Readings from Last 10 Encounters:  05/29/12 188 lb 6.4 oz (85.458 kg)  02/22/12 197 lb 8 oz (89.585 kg)  02/10/12 202 lb 7 oz (91.825 kg)  02/10/12 202 lb 7 oz (91.825 kg)  02/07/12 202 lb 7 oz (91.825 kg)  01/25/12 198 lb 3.2 oz (89.903 kg)  01/20/12 196 lb 6 oz (89.075 kg)  12/28/11 193 lb 1.6 oz (87.59 kg)  12/07/11 193 lb 4.8 oz (87.68 kg)  12/02/11 197 lb 3.2 oz (89.449 kg)     RD available for nutrition needs.   Iven Finn, MS, RD, LDN [phone]

## 2012-08-16 ENCOUNTER — Encounter (INDEPENDENT_AMBULATORY_CARE_PROVIDER_SITE_OTHER): Payer: Self-pay | Admitting: Surgery

## 2012-08-16 ENCOUNTER — Ambulatory Visit (INDEPENDENT_AMBULATORY_CARE_PROVIDER_SITE_OTHER): Payer: 59 | Admitting: Surgery

## 2012-08-16 VITALS — BP 132/88 | HR 60 | Temp 97.6°F | Resp 20 | Ht 71.0 in | Wt 190.0 lb

## 2012-08-16 DIAGNOSIS — C20 Malignant neoplasm of rectum: Secondary | ICD-10-CM

## 2012-08-16 NOTE — Progress Notes (Signed)
ASSESSMENT AND PLAN: 1. Rectal Cancer.   12 cm from anal verge.   Final path - invasive adenocarcinoma,  pyT1, N0, M0  (Surgery 09/01/2011)  Tumor regression - Grade 1 of III  (Stage III by rectal ultrasound, preoperative)   Completed neoadjuvant chemo and radiation therapy 07/27/2011. Chemotx supervised by Dr. Leonard Schwartz. [name]. Radiation therapy supervised by Dr. Augustine Radar.   GI doctor - Dr. Shela Commons. [name].  Okay to go ahead with colonoscopy.  He is doing well.  He talked a lot about his impression of the surgery.  I wrote him a note that he was here today.  I'll see him back in 6 months.   2.  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  3. Quit smoking in July 2012.  4. Atypical chest pain.    Negative stress test.  He saw Dr. Shela Commons. [name] 08/26/11. 6.  Impotence - seeing Dr. Imelda Pillow  On Cialis and doing better.  HISTORY OF PRESENT ILLNESS: Chief Complaint  Patient presents with  . Follow-up    6 month ileostomy reversal check   Casey [name] is a 51 y.o. (DOB: 1961-03-12)  white male who is a patient of MEYERS, STEPHEN, MD and comes to me today for follow up a rectal cancer.  He talked about a lot of issues.  He discussed his impotence - better.  Frequent BM's - better.  Twinges of pain in RLQ incision - about the same.  Talking to the nutritionist - he likes.  Talking to friends who had colon surgery.  No significant issue or concern.  Colon cancer history: Mr. Vanbenschoten had a LAR on 09/01/2011 for a rectal cancer at 10-12 cm from anal verge.  He had a protective ileostomy which I reversed 02/09/2012.   Social History: Works at [employer]. Married.  PHYSICAL EXAM: BP 132/88  Pulse 60  Temp 97.6 F (36.4 C) (Temporal)  Resp 20  Ht 5\' 11"  (1.803 m)  Wt 190 lb (86.183 kg)  BMI 26.50 kg/m2  General: WNWM who is alert and generally healthy appearing. Bearded. Lungs - Clear. Abdomen: Soft. No mass. No tenderness.  Normal bowel sounds. Lower midline wound okay.  RLQ incision  okay. Extemities:  Good strength and ROM  in upper and lower extremities.  DATA REVIEWED: Labs in Epic.  Ovidio Kin, MD, [facility] Pager: [phone] Office phone:  [phone]

## 2012-08-20 ENCOUNTER — Encounter: Payer: 59 | Admitting: Nutrition

## 2012-08-27 ENCOUNTER — Ambulatory Visit: Payer: 59 | Admitting: Nutrition

## 2012-08-28 NOTE — Assessment & Plan Note (Signed)
Casey [name] is a 51 year old male patient of Dr. Truett Perna diagnosed with rectal cancer, status post chemo and radiation therapy.  The patient reports history of ileostomy, which has now been reversed.   MEDICAL HISTORY INCLUDES:  Hypertension.  MEDICATIONS INCLUDE:  Melatonin.  LABS:  Glucose of 130.  HEIGHT:  5 feet 11 inches. WEIGHT:  190 pounds. USUAL BODY WEIGHT:  180 pounds. BMI:  26.51.  The patient has completed treatment.  He does complain of weakness.  He reports he used to have to use the bathroom 2 dozen times a day.  The frequency has decreased to 10-12 times a day.  He states he has the occasional  diarrhea stool.  However, this is not all the time.  His ileostomy was reversed.  He is trying to improve his diet, to decrease red meat and increase vegetables.  NUTRITION DIAGNOSIS:  Food and nutrition-related knowledge deficit related to diagnosis of rectal cancer and associated treatments as evidenced by no prior need for nutrition-related information.  INTERVENTION:  I educated the patient on strategies for increasing a healthy, plant-based diet.  I also educated him on the importance of decreasing red meat and processed meats.  We have discussed strategies for improving stool consistency, and I have educated him on a lower-iber diet for times when the patient does have diarrhea.  I have provided a fact sheet for the patient to take with him on interventions for diarrhea.  I answered his questions and provided my contact information.  MONITORING/EVALUATION (GOALS):  The patient will tolerate a healthier plant-based diet to reduce chance for recurrence and improve stool frequency.  NEXT VISIT:  Patient will contact me if he has questions or concerns.   ______________________________ Casey [name], RD, CSO, LDN Clinical Nutrition Specialist BN/MEDQ  D:  08/27/2012  T:  08/28/2012  Job:  [id number]

## 2012-09-05 ENCOUNTER — Telehealth (INDEPENDENT_AMBULATORY_CARE_PROVIDER_SITE_OTHER): Payer: Self-pay

## 2012-09-05 NOTE — Telephone Encounter (Signed)
Message copied by Ivory Broad on Wed Sep 05, 2012 10:49 AM ------      Message from: Cathi Roan      Created: Tue Sep 04, 2012  1:25 PM      Regarding: Last visit      Contact: [phone]       Please call patient between 10:30-2:30 tomorrow, Dr. Ezzard Standing needs to give written permission for colonoscopy to [name].

## 2012-09-05 NOTE — Telephone Encounter (Signed)
I returned the patient's call and let him know that it said in Dr Allene Pyo last note on him that it's ok to proceed with colonoscopy.  It looks like Dr [name] was routed the note so he should have received it.  I told him to let me know if I need to refax it.

## 2012-09-12 ENCOUNTER — Telehealth (INDEPENDENT_AMBULATORY_CARE_PROVIDER_SITE_OTHER): Payer: Self-pay

## 2012-09-12 NOTE — Telephone Encounter (Signed)
I called the patient on both his numbers and left a message on his cell.  I told him I don't about the confirmation from September since it was done through Epic.  He came by the other day and was given a copy of his note and Glenda faxed it.  I will refax it today.

## 2012-09-12 NOTE — Telephone Encounter (Signed)
Patient called in to see if you have the conformation on the progress notes that were sent to [name] office back on August 16, 2012. Medoff's office states they never received it now patient having a hard time getting scheduled for a procedure. His call back number is [phone].

## 2012-11-19 ENCOUNTER — Telehealth: Payer: Self-pay | Admitting: Oncology

## 2012-11-19 ENCOUNTER — Other Ambulatory Visit (HOSPITAL_BASED_OUTPATIENT_CLINIC_OR_DEPARTMENT_OTHER): Payer: 59

## 2012-11-19 ENCOUNTER — Ambulatory Visit (HOSPITAL_BASED_OUTPATIENT_CLINIC_OR_DEPARTMENT_OTHER): Payer: 59 | Admitting: Oncology

## 2012-11-19 VITALS — BP 145/99 | HR 77 | Temp 98.6°F | Resp 18 | Ht 71.0 in | Wt 193.9 lb

## 2012-11-19 DIAGNOSIS — C20 Malignant neoplasm of rectum: Secondary | ICD-10-CM

## 2012-11-19 DIAGNOSIS — M79609 Pain in unspecified limb: Secondary | ICD-10-CM

## 2012-11-19 LAB — CEA: CEA: <0.5 ng/mL (ref 0.0–5.0)

## 2012-11-19 NOTE — Progress Notes (Signed)
   [facility]    OFFICE PROGRESS NOTE   INTERVAL HISTORY:   He returns as scheduled. He feels well. Continues to have irregular bowel habits. He is working. Good appetite. He fatigues easily. Mr. Poehler reports undergoing a negative colonoscopy approximately 2 weeks ago.  Objective:  Vital signs in last 24 hours:  Blood pressure 145/99, pulse 77, temperature 98.6 F (37 C), temperature source Oral, resp. rate 18, height 5\' 11"  (1.803 m), weight 193 lb 14.4 oz (87.952 kg).    HEENT: Neck without mass Lymphatics: No cervical, supraclavicular, or inguinal nodes.? 1/2 cm soft mobile right axillary node. Resp: Lungs clear bilaterally Cardio: Regular rate and rhythm GI:  No hepatomegaly, no mass, nontender Vascular: No leg edema   Lab Results: CEA pending   Medications: I have reviewed the patient's current medications.  Assessment/Plan: 1. Rectal cancer, clinical stage III (uT3, uN1), status post an endoscopic biopsy on 05/30/2011.  a. Initiation of concurrent Xeloda and radiation on 06/20/2011, completed on 07/27/2011. b. Low anterior resection on 09/01/2011 with the pathology confirming a yPT1 N0 tumor. c. Initiation of adjuvant Xeloda chemotherapy with cycle #1 beginning 10/07/2011, cycle #2 10/29/2011, cycle #3 on 11/24/2011, Cycle #4 on 12/10/2011, cycle 5 on 12/31/2011. d. Laparoscopic-assisted ileostomy takedown on 02/10/2012 e. Restaging CTs of the chest, abdomen, and pelvis on 05/28/2012 revealed no evidence of metastatic disease. 2. History of multiple colorectal polyps. 3. Allergic rhinitis. 4. History of "chest heaviness and arm-leg pain" with a "hot" feeling during the course of chemotherapy and radiation, status post a negative cardiac evaluation by [name].  5. erectile dysfunction following rectal surgery/radiation-improved with Cialis   Disposition:  He remains in clinical remission from rectal cancer. He will return for an office  visit and CEA in 6 months. We will consider the indication for a surveillance CT evaluation when he returns in 6 months.   Thornton Papas, MD  11/19/2012  8:00 PM

## 2012-11-19 NOTE — Progress Notes (Signed)
Left VM with Dr. Jennye Boroughs office requesting recent colonoscopy/path report.

## 2012-11-19 NOTE — Telephone Encounter (Signed)
Gave pt appt for June 2014 lab and MD °

## 2012-11-20 ENCOUNTER — Telehealth: Payer: Self-pay | Admitting: *Deleted

## 2012-11-20 NOTE — Telephone Encounter (Signed)
Message copied by Phillis Knack on Tue Nov 20, 2012 10:37 AM ------      Message from: Wandalee Ferdinand      Created: Tue Nov 20, 2012 10:12 AM                   ----- Message -----         From: Ladene Artist, MD         Sent: 11/19/2012   9:06 PM           To: Wandalee Ferdinand, RN, Glori Luis, RN, #            Please call patient, cea is normal

## 2012-11-20 NOTE — Telephone Encounter (Signed)
Notified of cea results. Pt asked if it is ok to take flu shot, told him yes.

## 2013-03-21 ENCOUNTER — Encounter (INDEPENDENT_AMBULATORY_CARE_PROVIDER_SITE_OTHER): Payer: Self-pay | Admitting: Surgery

## 2013-03-21 ENCOUNTER — Ambulatory Visit (INDEPENDENT_AMBULATORY_CARE_PROVIDER_SITE_OTHER): Payer: 59 | Admitting: Surgery

## 2013-03-21 VITALS — BP 132/70 | HR 82 | Resp 18 | Ht 71.0 in | Wt 186.0 lb

## 2013-03-21 DIAGNOSIS — C20 Malignant neoplasm of rectum: Secondary | ICD-10-CM

## 2013-03-21 NOTE — Progress Notes (Addendum)
Name:  Casey [name] MRN:  [id number] Date of Birth:  [birth year]  ASSESSMENT AND PLAN: 1. Rectal Cancer.   12 cm from anal verge.   Final path - invasive adenocarcinoma,  pyT1, N0, M0  (Surgery 09/01/2011)  Tumor regression - Grade 1 of III  (Stage III by rectal ultrasound, preoperative)   Completed neoadjuvant chemo and radiation therapy 07/27/2011. Chemotx supervised by Dr. Leonard [name]. [name]. Radiation therapy supervised by Dr. Augustine [name].   GI doctor - Dr. Shela [name]. Casey [name].  Last colonoscopy by Dr. Kinnie [name] in Dec 2013.  He is doing well, except for frequent BM's.  Approx 10 per day.  I'll see him back in 6 months.  1a.  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   2.  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  3. Quit smoking in July 2012.  4. Atypical chest pain.    Negative stress test.  He saw Dr. Shela [name]. Casey [name] 08/26/11. 6.  Impotence - seeing Dr. Imelda [name]  On Cialis and doing better.  HISTORY OF PRESENT ILLNESS: Chief Complaint  Patient presents with  . Other    LTF rectal cancer check   Casey [name] is a 52 y.o. (DOB: [birth year])  white male who is a patient of [name], STEPHEN, MD and comes to me today for follow up a rectal cancer.  He talked about a lot of issues.  His biggest issue is frequent bowel movements.  He'll had 10+ per day.  He does notice that certain foods he eats can increase the frequency of the BM's, but he is still going multiple times a day.  He said last November he had a period of several weeks when his bowel habits seemed to be "normal", but since that time he has had the frequent BM's.  He notices no blood in the stool.  Of note, Dr. Kinnie [name] did note mild radiation proctitis.  He's concerned because of possible changes at work that may look on his frequent trips to the bathroom in an unfavorable light.  We talked about the possible sale of [employer]. He also still has twinges of pain in his lower midline incision and RLQ  incision.  Colon cancer history: [name] had a LAR on 09/01/2011 for a rectal cancer at 10-12 cm from anal verge.  He had a protective ileostomy which I reversed 02/09/2012.   Social History: Works at [employer].  He works second shift - 4 PM to 12:30 AM. Married.  PHYSICAL EXAM: BP 132/70  Pulse 82  Resp 18  Ht 5\' 11"  (1.803 m)  Wt 186 lb (84.369 kg)  BMI 25.95 kg/m2  General: WNWM who is alert and generally healthy appearing. Bearded. Lungs - Clear. Abdomen: Soft. No mass. No tenderness.  Normal bowel sounds. Lower midline wound okay.  RLQ incision okay. Rectal - I can fell the bottom of the staple line.  No mass or nodule. Extemities:  Good strength and ROM  in upper and lower extremities.  DATA REVIEWED: CEA - <0.5 on 11/19/2102  Casey Kin, MD, [facility] Pager: [phone] Office phone:  [phone]

## 2013-05-20 ENCOUNTER — Telehealth: Payer: Self-pay | Admitting: Oncology

## 2013-05-20 ENCOUNTER — Ambulatory Visit (HOSPITAL_BASED_OUTPATIENT_CLINIC_OR_DEPARTMENT_OTHER): Payer: 59 | Admitting: Oncology

## 2013-05-20 ENCOUNTER — Other Ambulatory Visit (HOSPITAL_BASED_OUTPATIENT_CLINIC_OR_DEPARTMENT_OTHER): Payer: 59 | Admitting: Lab

## 2013-05-20 VITALS — BP 145/90 | HR 71 | Temp 97.7°F | Resp 20 | Ht 71.0 in | Wt 186.0 lb

## 2013-05-20 DIAGNOSIS — C20 Malignant neoplasm of rectum: Secondary | ICD-10-CM

## 2013-05-20 LAB — CEA: CEA: 0.6 ng/mL (ref 0.0–5.0)

## 2013-05-20 NOTE — Progress Notes (Signed)
   [facility]    OFFICE PROGRESS NOTE   INTERVAL HISTORY:   He returns as scheduled. He continues to have frequent bowel movements. He reports malaise, but he continues to work full-time. He takes an occasional hydrocodone tablets for relief of "burning" discomfort at the ileostomy site. He is using Cialis and a "pump "for erectile dysfunction.  Objective:  Vital signs in last 24 hours:  Blood pressure 145/90, pulse 71, temperature 97.7 F (36.5 C), temperature source Oral, resp. rate 20, height 5\' 11"  (1.803 m), weight 186 lb (84.369 kg).    HEENT: Neck without mass Lymphatics: No cervical, supraclavicular, axillary, or inguinal nodes Resp: Lungs clear bilaterally Cardio: Regular rate and rhythm GI: No hepatomegaly, no mass Vascular: No leg edema   Lab Results:     Medications: I have reviewed the patient's current medications.  Assessment/Plan: 1. Rectal cancer, clinical stage III (uT3, uN1), status post an endoscopic biopsy on 05/30/2011.  a. Initiation of concurrent Xeloda and radiation on 06/20/2011, completed on 07/27/2011. b. Low anterior resection on 09/01/2011 with the pathology confirming a yPT1 N0 tumor. c. Initiation of adjuvant Xeloda chemotherapy with cycle #1 beginning 10/07/2011, cycle #2 10/29/2011, cycle #3 on 11/24/2011, Cycle #4 on 12/10/2011, cycle 5 on 12/31/2011. d. Laparoscopic-assisted ileostomy takedown on 02/10/2012 e. Restaging CTs of the chest, abdomen, and pelvis on 05/28/2012 revealed no evidence of metastatic disease. f. Negative surveillance colonoscopy 11/05/2012 2. History of multiple colorectal polyps. 3. Allergic rhinitis. 4. History of "chest heaviness and arm-leg pain" with a "hot" feeling during the course of chemotherapy and radiation, status post a negative cardiac evaluation by [name].  5. erectile dysfunction following rectal surgery/radiation-improved with Cialis    Disposition:  Mr. Corker remains  in clinical remission from rectal cancer. We will followup on the CEA from today. He will return for an office visit and CEA in 6 months. I do not consider him at high risk of developing recurrent rectal cancer. We decided against surveillance CT scans.   Thornton Papas, MD  05/20/2013  11:47 AM

## 2013-05-20 NOTE — Telephone Encounter (Signed)
gv and printed appt sched and avs for pt  °

## 2013-05-22 ENCOUNTER — Telehealth: Payer: Self-pay | Admitting: *Deleted

## 2013-05-22 NOTE — Telephone Encounter (Signed)
Called pt at home and left message on voice mail re: asked pt to call office back for further info from Dr. Truett Perna.

## 2013-05-23 ENCOUNTER — Telehealth: Payer: Self-pay | Admitting: *Deleted

## 2013-05-23 NOTE — Telephone Encounter (Signed)
Notified CEA is 0.6-normal. F/U as scheduled.

## 2013-05-23 NOTE — Telephone Encounter (Signed)
Message copied by Wandalee Ferdinand on Thu May 23, 2013  3:55 PM ------      Message from: Thornton Papas B      Created: Mon May 20, 2013  6:10 PM       Please call patient, cea is normal ------

## 2013-06-12 ENCOUNTER — Other Ambulatory Visit: Payer: Self-pay | Admitting: Orthopaedic Surgery

## 2013-06-12 DIAGNOSIS — M25512 Pain in left shoulder: Secondary | ICD-10-CM

## 2013-06-22 ENCOUNTER — Ambulatory Visit
Admission: RE | Admit: 2013-06-22 | Discharge: 2013-06-22 | Disposition: A | Discharge status: Home / Self Care | Payer: 59 | Source: Ambulatory Visit | Attending: Orthopaedic Surgery | Admitting: Orthopaedic Surgery

## 2013-06-22 DIAGNOSIS — M25512 Pain in left shoulder: Secondary | ICD-10-CM

## 2013-10-16 ENCOUNTER — Encounter (INDEPENDENT_AMBULATORY_CARE_PROVIDER_SITE_OTHER): Payer: Self-pay | Admitting: Surgery

## 2013-10-16 ENCOUNTER — Ambulatory Visit (INDEPENDENT_AMBULATORY_CARE_PROVIDER_SITE_OTHER): Payer: 59 | Admitting: Surgery

## 2013-10-16 VITALS — BP 120/80 | HR 72 | Temp 98.1°F | Resp 14 | Ht 71.0 in | Wt 194.0 lb

## 2013-10-16 DIAGNOSIS — C20 Malignant neoplasm of rectum: Secondary | ICD-10-CM

## 2013-10-16 NOTE — Progress Notes (Signed)
Name:  Casey [name] MRN:  [id number] Date of Birth:  1961/06/03  ASSESSMENT AND PLAN: 1. Rectal Cancer.   12 cm from anal verge.   Final path - invasive adenocarcinoma,  pyT1, N0, M0  (Surgery 09/01/2011)  Tumor regression - Grade 1 of III  (Stage III by rectal ultrasound, preoperative)   Completed neoadjuvant chemo and radiation therapy 07/27/2011. Chemotx supervised by Dr. Leonard [name]. [name]. Radiation therapy supervised by Dr. Augustine [name].   GI doctor - Dr. Shela [name]. Casey [name].  Last colonoscopy by Dr. Kinnie [name] in Dec 2013.  He is doing well, except for frequent BM's.  Approx 10 - 14 per day.  This is better than a year ago, but still a long way from resolved.  I'll see him back in 6 months.  1a.  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   2.  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  3.  Quit smoking in July 2012.  4.  Atypical chest pain.    Negative stress test.  He saw Dr. Shela [name]. Casey [name] 08/26/11. 6.  Impotence - seeing Dr. Imelda [name]  On Cialis and doing better.  HISTORY OF PRESENT ILLNESS: Chief Complaint  Patient presents with  . Follow-up    6 mo rectal ca   Casey [name] is a 52 y.o. (DOB: 1960/12/06)  white male who is a patient of MEYERS, STEPHEN, MD and comes to me today for follow up a rectal cancer.  He is overall doing well. He is still struggling with frequent small BMs.  I think that this is a combination of the surgery and small rectal vault and the radiation proctitis.  It is better, but he is also more used to it. Otherwise he is doing well.  We talked about [employer] and how he is doing at work.  Colon cancer history: [name] had a LAR on 09/01/2011 for a rectal cancer at 10-12 cm from anal verge.  He had a protective ileostomy which I reversed 02/09/2012.   Social History: Works at [employer].  He works second shift - 4 PM to 12:30 AM. Married.  PHYSICAL EXAM: BP 120/80  Pulse 72  Temp(Src) 98.1 F (36.7 C) (Temporal)   Resp 14  Ht 5\' 11"  (1.803 m)  Wt 194 lb (87.998 kg)  BMI 27.07 kg/m2  General: WNWM who is alert and generally healthy appearing. Bearded. Lungs - Clear. Abdomen: Soft. No mass. No tenderness.  Normal bowel sounds. Lower midline wound okay.  RLQ incision okay. Rectal - I can fell the bottom of the staple line.  No mass or nodule.  I cannot find a specific problem that is causing his frequent stools. Extemities:  Good strength and ROM  in upper and lower extremities.  DATA REVIEWED: CEA - 0.6 on 05/20/2013  Ovidio Kin, MD, [facility] Pager: [phone] Office phone:  [phone]

## 2013-11-18 ENCOUNTER — Telehealth: Payer: Self-pay | Admitting: Oncology

## 2013-11-18 ENCOUNTER — Ambulatory Visit (HOSPITAL_BASED_OUTPATIENT_CLINIC_OR_DEPARTMENT_OTHER): Payer: 59 | Admitting: Oncology

## 2013-11-18 ENCOUNTER — Other Ambulatory Visit (HOSPITAL_BASED_OUTPATIENT_CLINIC_OR_DEPARTMENT_OTHER): Payer: 59

## 2013-11-18 ENCOUNTER — Encounter (INDEPENDENT_AMBULATORY_CARE_PROVIDER_SITE_OTHER): Payer: Self-pay

## 2013-11-18 ENCOUNTER — Other Ambulatory Visit: Payer: Self-pay | Admitting: *Deleted

## 2013-11-18 VITALS — BP 125/80 | HR 80 | Temp 97.7°F | Resp 20 | Ht 71.0 in | Wt 193.6 lb

## 2013-11-18 DIAGNOSIS — C20 Malignant neoplasm of rectum: Secondary | ICD-10-CM

## 2013-11-18 DIAGNOSIS — Z85048 Personal history of other malignant neoplasm of rectum, rectosigmoid junction, and anus: Secondary | ICD-10-CM

## 2013-11-18 DIAGNOSIS — K599 Functional intestinal disorder, unspecified: Secondary | ICD-10-CM

## 2013-11-18 NOTE — Progress Notes (Signed)
   [facility]    OFFICE PROGRESS NOTE   INTERVAL HISTORY:   He returns for scheduled followup of rectal cancer. He feels well. He continues to work. Cialis helped the erectile dysfunction. He continues to have rectal urgency and frequent small bowel movements. He reports having 12-24 bowel movements per day. No difficulty with urination.  Objective:  Vital signs in last 24 hours:  Blood pressure 125/80, pulse 80, temperature 97.7 F (36.5 C), temperature source Oral, resp. rate 20, height 5\' 11"  (1.803 m), weight 193 lb 9.6 oz (87.816 kg).    HEENT: Neck without mass Lymphatics: No cervical, supraclavicular, axillary, or inguinal nodes Resp: Lungs clear bilaterally Cardio: Regular rate and rhythm GI: No hepatosplenomegaly, nontender, no mass Vascular: No leg edema   Lab Results:  CEA pending   Medications: I have reviewed the patient's current medications.  Assessment/Plan: 1. Rectal cancer, clinical stage III (uT3, uN1), status post an endoscopic biopsy on 05/30/2011.  a. Initiation of concurrent Xeloda and radiation on 06/20/2011, completed on 07/27/2011. b. Low anterior resection on 09/01/2011 with the pathology confirming a yPT1 N0 tumor. c. Initiation of adjuvant Xeloda chemotherapy with cycle #1 beginning 10/07/2011, cycle #2 10/29/2011, cycle #3 on 11/24/2011, Cycle #4 on 12/10/2011, cycle 5 on 12/31/2011. d. Laparoscopic-assisted ileostomy takedown on 02/10/2012 e. Restaging CTs of the chest, abdomen, and pelvis on 05/28/2012 revealed no evidence of metastatic disease. f. Negative surveillance colonoscopy 11/05/2012 2. History of multiple colorectal polyps. 3. Allergic rhinitis. 4. History of "chest heaviness and arm-leg pain" with a "hot" feeling during the course of chemotherapy and radiation, status post a negative cardiac evaluation by [name].  5. erectile dysfunction following rectal surgery/radiation-improved with Cialis     Disposition:  Mr. Pitta remains in clinical remission from rectal cancer. We will followup on the CEA from today. He will return for an office visit and CEA in 6 months. He continues to have rectal dysfunction. We will make a referral to the [facility] Pelvic physical therapy program.   Thornton Papas, MD  11/18/2013  12:14 PM

## 2013-11-18 NOTE — Telephone Encounter (Signed)
Called [facility] they will call pt to schedule

## 2013-11-18 NOTE — Progress Notes (Signed)
Met with patient and explained role of navigator.  Referral made to outpatient rehab PT for incontinence and pelvic dysfunction per request of Dr. Truett Perna.  Written  Information on PT cancer rehab program was provided to patient.

## 2013-11-18 NOTE — Telephone Encounter (Signed)
GAve pt appt for lab and MD for june 2015

## 2013-11-19 LAB — CEA: CEA: <0.5 ng/mL (ref 0.0–5.0)

## 2013-11-20 ENCOUNTER — Telehealth: Payer: Self-pay | Admitting: *Deleted

## 2013-11-20 NOTE — Telephone Encounter (Signed)
Message copied by Raphael Gibney on Wed Nov 20, 2013 11:15 AM ------      Message from: Wandalee Ferdinand      Created: Wed Nov 20, 2013 11:14 AM                   ----- Message -----         From: Ladene Artist, MD         Sent: 11/19/2013   8:15 PM           To: Wandalee Ferdinand, RN, Glori Luis, RN, #            Please call patient, cea is normal ------

## 2013-11-20 NOTE — Telephone Encounter (Signed)
Called and informed patient of normal cea.  Per Dr. [name].  Patient verbalized understanding.  

## 2013-11-22 ENCOUNTER — Telehealth: Payer: Self-pay | Admitting: *Deleted

## 2013-11-22 NOTE — Telephone Encounter (Signed)
Called pt with lab results. He voiced understanding.

## 2013-11-22 NOTE — Telephone Encounter (Signed)
Message copied by Caleb Popp on Fri Nov 22, 2013 12:10 PM ------      Message from: Ladene Artist      Created: Tue Nov 19, 2013  8:15 PM       Please call patient, cea is normal ------

## 2013-11-27 ENCOUNTER — Ambulatory Visit: Payer: 59 | Attending: Oncology | Admitting: Physical Therapy

## 2013-11-27 DIAGNOSIS — M629 Disorder of muscle, unspecified: Secondary | ICD-10-CM | POA: Insufficient documentation

## 2013-11-27 DIAGNOSIS — IMO0001 Reserved for inherently not codable concepts without codable children: Secondary | ICD-10-CM | POA: Insufficient documentation

## 2013-11-27 DIAGNOSIS — M2420 Disorder of ligament, unspecified site: Secondary | ICD-10-CM | POA: Insufficient documentation

## 2013-12-02 ENCOUNTER — Ambulatory Visit: Payer: 59 | Attending: Oncology | Admitting: Physical Therapy

## 2013-12-02 DIAGNOSIS — IMO0001 Reserved for inherently not codable concepts without codable children: Secondary | ICD-10-CM | POA: Insufficient documentation

## 2013-12-02 DIAGNOSIS — M629 Disorder of muscle, unspecified: Secondary | ICD-10-CM | POA: Insufficient documentation

## 2013-12-02 DIAGNOSIS — M2420 Disorder of ligament, unspecified site: Secondary | ICD-10-CM | POA: Insufficient documentation

## 2013-12-09 ENCOUNTER — Ambulatory Visit: Payer: 59 | Admitting: Physical Therapy

## 2013-12-16 ENCOUNTER — Ambulatory Visit: Payer: 59 | Admitting: Physical Therapy

## 2013-12-23 ENCOUNTER — Ambulatory Visit: Payer: 59 | Admitting: Physical Therapy

## 2013-12-30 ENCOUNTER — Ambulatory Visit: Payer: 59 | Attending: Family Medicine | Admitting: Physical Therapy

## 2013-12-30 DIAGNOSIS — IMO0001 Reserved for inherently not codable concepts without codable children: Secondary | ICD-10-CM | POA: Insufficient documentation

## 2013-12-30 DIAGNOSIS — M629 Disorder of muscle, unspecified: Secondary | ICD-10-CM | POA: Insufficient documentation

## 2013-12-30 DIAGNOSIS — M2420 Disorder of ligament, unspecified site: Secondary | ICD-10-CM | POA: Insufficient documentation

## 2014-01-03 ENCOUNTER — Telehealth: Payer: Self-pay | Admitting: *Deleted

## 2014-01-03 NOTE — Telephone Encounter (Signed)
Called and informed patient to bring in [employer], so a letter can be written to excuse patient.  Per Dr. Benay Spice.  Patient verbalized understanding.

## 2014-01-06 ENCOUNTER — Ambulatory Visit: Payer: 59 | Admitting: Physical Therapy

## 2014-01-13 ENCOUNTER — Ambulatory Visit: Payer: 59 | Admitting: Physical Therapy

## 2014-01-20 ENCOUNTER — Ambulatory Visit: Payer: 59 | Admitting: Physical Therapy

## 2014-01-22 ENCOUNTER — Telehealth: Payer: Self-pay | Admitting: *Deleted

## 2014-01-22 NOTE — Telephone Encounter (Signed)
Pt dropped off jury duty summons. Requesting letter detailing why he is unable to report for jury duty. Form to Dr. Benay Spice to complete letter. Pt requested I leave message at home # to confirm we received his summons. Same done.

## 2014-01-23 ENCOUNTER — Encounter: Payer: Self-pay | Admitting: Oncology

## 2014-01-24 ENCOUNTER — Encounter: Payer: Self-pay | Admitting: *Deleted

## 2014-01-24 NOTE — Progress Notes (Signed)
Jury duty letter with summons mailed via envelope with address provided by patient.

## 2014-01-27 ENCOUNTER — Ambulatory Visit: Payer: 59 | Admitting: Physical Therapy

## 2014-02-03 ENCOUNTER — Ambulatory Visit: Payer: 59 | Attending: Family Medicine | Admitting: Physical Therapy

## 2014-02-03 DIAGNOSIS — M629 Disorder of muscle, unspecified: Secondary | ICD-10-CM | POA: Insufficient documentation

## 2014-02-03 DIAGNOSIS — M2420 Disorder of ligament, unspecified site: Secondary | ICD-10-CM | POA: Insufficient documentation

## 2014-02-03 DIAGNOSIS — IMO0001 Reserved for inherently not codable concepts without codable children: Secondary | ICD-10-CM | POA: Insufficient documentation

## 2014-02-17 ENCOUNTER — Ambulatory Visit: Payer: 59 | Admitting: Physical Therapy

## 2014-02-19 ENCOUNTER — Telehealth: Payer: Self-pay | Admitting: *Deleted

## 2014-02-19 NOTE — Telephone Encounter (Signed)
Reports a lima bean sized mass under the skin at his left breast for past 3-4 days. Mass is soft and mobile. Not painful or red and no fever. Is requesting Dr. Benay Spice see him about it. Made him aware that it is highly unlikely this is related to his cancer, but will relay his concern to Dr. Benay Spice.  Suggested he have PCP assess this-does not have a PCP and refuses to go back to Dr. Olen Pel. Is willing to to see his wife's PCP or someone with [facility] if Dr. Benay Spice thinks he should do this instead.

## 2014-02-20 ENCOUNTER — Telehealth: Payer: Self-pay | Admitting: Oncology

## 2014-02-20 ENCOUNTER — Other Ambulatory Visit: Payer: Self-pay | Admitting: *Deleted

## 2014-02-20 NOTE — Progress Notes (Signed)
Orders entered for office visit 3/27 to evaluate L breast mass. Left message on voicemail for pt to call office.

## 2014-02-21 ENCOUNTER — Ambulatory Visit (HOSPITAL_BASED_OUTPATIENT_CLINIC_OR_DEPARTMENT_OTHER): Payer: 59 | Admitting: Oncology

## 2014-02-21 VITALS — BP 126/77 | HR 73 | Temp 98.0°F | Resp 18 | Ht 71.0 in | Wt 201.2 lb

## 2014-02-21 DIAGNOSIS — N63 Unspecified lump in unspecified breast: Secondary | ICD-10-CM

## 2014-02-21 DIAGNOSIS — C20 Malignant neoplasm of rectum: Secondary | ICD-10-CM

## 2014-02-21 NOTE — Progress Notes (Signed)
  [place] OFFICE PROGRESS NOTE   Diagnosis: Rectal cancer  INTERVAL HISTORY:   Casey [name] returns for an unscheduled visit. He has noted a lump in the upper left breast for the past few weeks. He believes he may have injured this area on a piece of furniture. He reports improvement in the rectal urgency/frequency since beginning physical therapy.  Objective:  Vital signs in last 24 hours:  Blood pressure 126/77, pulse 73, temperature 98 F (36.7 C), temperature source Oral, resp. rate 18, height 5\' 11"  (1.803 m), weight 201 lb 3.2 oz (91.264 kg), SpO2 97.00%.    HEENT: Neck without mass Lymphatics: No cervical, supraclavicular, or axillary node Breasts: Right breast without mass. At the 2:00 position of the right breast and a few centimeters lateral to the areola there is a 1 cm firm oval cutaneous nodule. The nodule is a mobile.  Lab Results:   Lab Results  Component Value Date   CEA <0.5 11/18/2013    Imaging:  No results found.  Medications: I have reviewed the patient's current medications.  Assessment/Plan: 1. Rectal cancer, clinical stage III (uT3, uN1), status post an endoscopic biopsy on 05/30/2011.  a. Initiation of concurrent Xeloda and radiation on 06/20/2011, completed on 07/27/2011. b. Low anterior resection on 09/01/2011 with the pathology confirming a yPT1 N0 tumor. c. Initiation of adjuvant Xeloda chemotherapy with cycle #1 beginning 10/07/2011, cycle #2 10/29/2011, cycle #3 on 11/24/2011, Cycle #4 on 12/10/2011, cycle 5 on 12/31/2011. d. Laparoscopic-assisted ileostomy takedown on 02/10/2012 e. Restaging CTs of the chest, abdomen, and pelvis on 05/28/2012 revealed no evidence of metastatic disease. f. Negative surveillance colonoscopy 11/05/2012 2. History of multiple colorectal polyps. 3. Allergic rhinitis. 4. History of "chest heaviness and arm-leg pain" with a "hot" feeling during the course of chemotherapy and radiation, status  post a negative cardiac evaluation by Dr. Percival Spanish.  5. erectile dysfunction following rectal surgery/radiation-improved with Cialis  6. Left breast nodule    Disposition:  The left breast nodule is likely a benign cyst or potentially a resolving hematoma. I have a low clinical suspicion for breast cancer or metastatic rectal cancer involving the breast. He will ask Dr. Lucia Gaskins to check this area when he sees him in June. [name] will return for an office visit as scheduled in July. He will contact us if this lesion enlarges.  Betsy Coder, MD  02/21/2014  11:10 AM

## 2014-02-23 ENCOUNTER — Telehealth: Payer: Self-pay | Admitting: Oncology

## 2014-02-23 NOTE — Telephone Encounter (Signed)
PER 3/27 POF F/U AS SCHEDULED

## 2014-03-10 ENCOUNTER — Ambulatory Visit: Payer: 59 | Attending: Family Medicine | Admitting: Physical Therapy

## 2014-03-10 DIAGNOSIS — M629 Disorder of muscle, unspecified: Secondary | ICD-10-CM | POA: Insufficient documentation

## 2014-03-10 DIAGNOSIS — M2420 Disorder of ligament, unspecified site: Secondary | ICD-10-CM | POA: Insufficient documentation

## 2014-03-10 DIAGNOSIS — IMO0001 Reserved for inherently not codable concepts without codable children: Secondary | ICD-10-CM | POA: Insufficient documentation

## 2014-03-25 ENCOUNTER — Ambulatory Visit: Payer: 59 | Admitting: Physical Therapy

## 2014-04-07 ENCOUNTER — Ambulatory Visit: Payer: 59 | Attending: Family Medicine | Admitting: Physical Therapy

## 2014-04-07 DIAGNOSIS — M2420 Disorder of ligament, unspecified site: Secondary | ICD-10-CM | POA: Insufficient documentation

## 2014-04-07 DIAGNOSIS — M629 Disorder of muscle, unspecified: Secondary | ICD-10-CM | POA: Insufficient documentation

## 2014-04-07 DIAGNOSIS — IMO0001 Reserved for inherently not codable concepts without codable children: Secondary | ICD-10-CM | POA: Insufficient documentation

## 2014-04-17 ENCOUNTER — Ambulatory Visit (INDEPENDENT_AMBULATORY_CARE_PROVIDER_SITE_OTHER): Payer: 59 | Admitting: Surgery

## 2014-04-17 ENCOUNTER — Encounter (INDEPENDENT_AMBULATORY_CARE_PROVIDER_SITE_OTHER): Payer: Self-pay | Admitting: Surgery

## 2014-04-17 VITALS — BP 122/76 | HR 74 | Temp 97.2°F | Ht 71.0 in | Wt 201.0 lb

## 2014-04-17 DIAGNOSIS — C20 Malignant neoplasm of rectum: Secondary | ICD-10-CM

## 2014-04-17 NOTE — Progress Notes (Signed)
Name:  Casey [name] MRN:  [id number] Date of Birth:  [birth year]  ASSESSMENT AND PLAN: 1. Rectal Cancer.   12 cm from anal verge.   Final path - invasive adenocarcinoma,  pyT1, N0, M0  (Surgery 09/01/2011)  Tumor regression - Grade 1 of III  (Stage III by rectal ultrasound, preoperative)   Completed neoadjuvant chemo and radiation therapy 07/27/2011. Chemotx supervised by Dr. Jacinto Reap. [name]. Radiation therapy supervised by Dr. Paris Lore.   GI doctor - Dr. Lenna Sciara. Earlean Shawl.  Last colonoscopy by Dr. Earlean Shawl in Dec 2013.  HIs next planned colonoscopy is 2016.  Some improvement in his bowels since I last saw him.  I'll see him back in 6 months.  1a.  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   2.  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  3.  Quit smoking in July 2012.  4.  Atypical chest pain.    Negative stress test.  He saw Dr. Lenna Sciara. Casey [name] 08/26/11. 6.  Impotence - seeing Dr. Arlyn Leak  On Cialis and doing better. 7.  Left breast nodule  This has almost gone away and I cannot feel a discrete nodule.  HISTORY OF PRESENT ILLNESS: No chief complaint on file.  Casey [name] is a 53 y.o. (DOB: 1961/06/06)  white male who is a patient of MEYERS, STEPHEN, MD and comes to me today for follow up a rectal cancer.  He is overall doing well. He is doing better with his BM's - now a bad day is 12 BM/day.  His burning and lower abdominal pain is better.  And he is having fewer leaks/accidents with his BM's.  He is getting some therapy for this. We spent most of the time talking about a nodule that he felt in his left breast/chest several weeks ago.  He saw Dr. Benay Spice about it on 02/21/2014.  It was tender and more obvious a couple of weeks ago.  He actually had trouble showing it to me. We talked about [place] and how he is doing at work.  Colon cancer history: Mr. Noyce had a LAR on 09/01/2011 for a rectal cancer at 10-12 cm from anal verge.  He had a  protective ileostomy which I reversed 02/09/2012.   Social History: Works at [employer].  He works second shift - 4 PM to 12:30 AM. Married.  PHYSICAL EXAM: BP 122/76  Pulse 74  Temp(Src) 97.2 F (36.2 C)  Ht 5\' 11"  (1.803 m)  Wt 201 lb (91.173 kg)  BMI 28.05 kg/m2  General: WNWM who is alert and generally healthy appearing. Bearded. Lymph nodes:  No cervical, supraclavicular, axillary, or inguinal nodes. Lungs - Clear. Chest/breasts :  Right - no mass  Left - he pointed to an area at the 1 - 2 o'clock edge of the areola.  But I did not think that I felt a specific mass.   I did an US of the area and saw no mass.  He is seeing Dr. Benay Spice back in a few weeks and will show the area to him again. Abdomen: Soft. No mass. No tenderness.  Normal bowel sounds. Lower midline wound okay.  RLQ incision okay. Rectal - I did not do a rectal today. Extemities:  Good strength and ROM  in upper and lower extremities.  DATA REVIEWED: CEA - <0.5 on 11/18/2013  Alphonsa Overall, MD, [facility] Surgery Pager: [phone] Office phone:  [phone]

## 2014-04-22 ENCOUNTER — Ambulatory Visit: Payer: 59 | Admitting: Physical Therapy

## 2014-05-05 ENCOUNTER — Ambulatory Visit: Payer: 59 | Admitting: Physical Therapy

## 2014-05-12 ENCOUNTER — Ambulatory Visit: Payer: 59 | Attending: Family Medicine | Admitting: Physical Therapy

## 2014-05-12 DIAGNOSIS — M2420 Disorder of ligament, unspecified site: Secondary | ICD-10-CM | POA: Insufficient documentation

## 2014-05-12 DIAGNOSIS — IMO0001 Reserved for inherently not codable concepts without codable children: Secondary | ICD-10-CM | POA: Insufficient documentation

## 2014-05-12 DIAGNOSIS — M629 Disorder of muscle, unspecified: Secondary | ICD-10-CM | POA: Insufficient documentation

## 2014-05-30 ENCOUNTER — Ambulatory Visit: Payer: 59 | Admitting: Oncology

## 2014-05-30 ENCOUNTER — Other Ambulatory Visit: Payer: 59

## 2014-06-02 ENCOUNTER — Other Ambulatory Visit (HOSPITAL_BASED_OUTPATIENT_CLINIC_OR_DEPARTMENT_OTHER): Payer: 59

## 2014-06-02 ENCOUNTER — Ambulatory Visit (HOSPITAL_BASED_OUTPATIENT_CLINIC_OR_DEPARTMENT_OTHER): Payer: 59 | Admitting: Oncology

## 2014-06-02 ENCOUNTER — Telehealth: Payer: Self-pay | Admitting: Oncology

## 2014-06-02 VITALS — BP 140/87 | HR 78 | Temp 97.9°F | Resp 18 | Ht 71.0 in | Wt 201.1 lb

## 2014-06-02 DIAGNOSIS — C20 Malignant neoplasm of rectum: Secondary | ICD-10-CM

## 2014-06-02 NOTE — Progress Notes (Signed)
  [name] OFFICE PROGRESS NOTE   Diagnosis: Rectal cancer  INTERVAL HISTORY:   [name] returns as scheduled. He is being treated at the physical therapy clinic for rectal frequency. This has helped. He continues to have up to 10 bowel movements per day intermittently. Good appetite. He continues to work. No change in the palpable fullness at the left breast.  Objective:  Vital signs in last 24 hours:  Blood pressure 140/87, pulse 78, temperature 97.9 F (36.6 C), temperature source Oral, resp. rate 18, height 5\' 11"  (1.803 m), weight 201 lb 1.6 oz (91.218 kg).    HEENT: Neck without mass Lymphatics: No cervical, supra-clavicular, axillary, or inguinal nodes Resp: Lungs clear bilaterally Cardio: Regular rate and rhythm GI: No hepatomegaly, nontender, no mass Vascular: No leg edema, the left lower leg is slightly larger than the right side  Breast: Left breast without mass    Lab Results  Component Value Date   CEA <0.5 11/18/2013    Imaging:  No results found.  Medications: I have reviewed the patient's current medications.  Assessment/Plan: 1. Rectal cancer, clinical stage III (uT3, uN1), status post an endoscopic biopsy on 05/30/2011.  a. Initiation of concurrent Xeloda and radiation on 06/20/2011, completed on 07/27/2011. b. Low anterior resection on 09/01/2011 with the pathology confirming a yPT1 N0 tumor. c. Initiation of adjuvant Xeloda chemotherapy with cycle #1 beginning 10/07/2011, cycle #2 10/29/2011, cycle #3 on 11/24/2011, Cycle #4 on 12/10/2011, cycle 5 on 12/31/2011. d. Laparoscopic-assisted ileostomy takedown on 02/10/2012 e. Restaging CTs of the chest, abdomen, and pelvis on 05/28/2012 revealed no evidence of metastatic disease. f. Negative surveillance colonoscopy 11/05/2012 2. History of multiple colorectal polyps. 3. Allergic rhinitis. 4. History of "chest heaviness and arm-leg pain" with a "hot" feeling during the course of  chemotherapy and radiation, status post a negative cardiac evaluation by Dr. Percival Spanish.  5. erectile dysfunction following rectal surgery/radiation-improved with Cialis  6. Chronic rectal urgency/frequency following treatment for rectal cancer-now followed at the pelvic physical therapy clinic 7.  History of a left breast nodule-negative ultrasound by Dr. Lucia Gaskins 04/17/2014    Disposition: [name] remains in clinical remission from rectal cancer. He will return for an office visit and CEA in 6 months. We will followup on the CEA from today.    Betsy Coder, MD  06/02/2014  12:43 PM

## 2014-06-02 NOTE — Telephone Encounter (Signed)
gv and printed appt sched and avs for pt for Dec °

## 2014-06-03 ENCOUNTER — Ambulatory Visit: Payer: 59 | Attending: Family Medicine | Admitting: Physical Therapy

## 2014-06-03 ENCOUNTER — Telehealth: Payer: Self-pay | Admitting: *Deleted

## 2014-06-03 DIAGNOSIS — M629 Disorder of muscle, unspecified: Secondary | ICD-10-CM | POA: Insufficient documentation

## 2014-06-03 DIAGNOSIS — M2420 Disorder of ligament, unspecified site: Secondary | ICD-10-CM | POA: Insufficient documentation

## 2014-06-03 DIAGNOSIS — IMO0001 Reserved for inherently not codable concepts without codable children: Secondary | ICD-10-CM | POA: Insufficient documentation

## 2014-06-03 LAB — CEA: CEA: 0.6 ng/mL (ref 0.0–5.0)

## 2014-06-03 NOTE — Telephone Encounter (Signed)
Message copied by Tania Ade on Tue Jun 03, 2014 11:56 AM ------      Message from: [name]      Created: Tue Jun 03, 2014  7:36 AM       Please call patient, cea is normal ------

## 2014-06-03 NOTE — Telephone Encounter (Signed)
Notified of normal CEA. 

## 2014-06-24 ENCOUNTER — Ambulatory Visit: Payer: 59 | Admitting: Physical Therapy

## 2014-06-24 DIAGNOSIS — IMO0001 Reserved for inherently not codable concepts without codable children: Secondary | ICD-10-CM | POA: Diagnosis not present

## 2014-08-06 ENCOUNTER — Telehealth: Payer: Self-pay | Admitting: *Deleted

## 2014-08-06 NOTE — Telephone Encounter (Signed)
Pt called asking "can Dr. Benay Spice fill my hydrocodone medicine? I pulled my back out with mulch and it was last filled in 2013 and I don't take it that often and he was the last one to give it to me"  Per Dr. Benay Spice; notified pt that MD would not be able to fill med d/t not oncology related and to contact his PCP.  Pt verbalized understanding and expressed appreciation for call back.

## 2014-11-25 ENCOUNTER — Ambulatory Visit (HOSPITAL_BASED_OUTPATIENT_CLINIC_OR_DEPARTMENT_OTHER): Payer: 59 | Admitting: Oncology

## 2014-11-25 ENCOUNTER — Telehealth: Payer: Self-pay | Admitting: Oncology

## 2014-11-25 ENCOUNTER — Other Ambulatory Visit (HOSPITAL_BASED_OUTPATIENT_CLINIC_OR_DEPARTMENT_OTHER): Payer: 59

## 2014-11-25 VITALS — BP 131/88 | HR 84 | Temp 98.5°F | Resp 18 | Ht 71.0 in | Wt 207.4 lb

## 2014-11-25 DIAGNOSIS — Z85048 Personal history of other malignant neoplasm of rectum, rectosigmoid junction, and anus: Secondary | ICD-10-CM

## 2014-11-25 DIAGNOSIS — C20 Malignant neoplasm of rectum: Secondary | ICD-10-CM

## 2014-11-25 NOTE — Progress Notes (Signed)
  [place] OFFICE PROGRESS NOTE   Diagnosis: Rectal cancer  INTERVAL HISTORY:   He returns as scheduled. He feels well. He continues to have frequent bowel movements. He has attended a pelvic physical therapy program.  Objective:  Vital signs in last 24 hours:  Blood pressure 131/88, pulse 84, temperature 98.5 F (36.9 C), temperature source Oral, resp. rate 18, height 5\' 11"  (1.803 m), weight 207 lb 6.4 oz (94.076 kg), SpO2 99 %.    HEENT: Neck without mass Lymphatics: No cervical, supraclavicular, axillary, or inguinal nodes Resp: Lungs clear bilaterally Cardio: Regular rate and rhythm GI: No hepatomegaly, nontender, no mass Vascular: No leg edema Breasts: Left breast without mass    Lab Results:   Lab Results  Component Value Date   CEA 0.6 06/02/2014   Medications: I have reviewed the patient's current medications.  Assessment/Plan: 1. Rectal cancer, clinical stage III (uT3, uN1), status post an endoscopic biopsy on 05/30/2011.  1. Initiation of concurrent Xeloda and radiation on 06/20/2011, completed on 07/27/2011. 2. Low anterior resection on 09/01/2011 with the pathology confirming a yPT1 N0 tumor. 3. Initiation of adjuvant Xeloda chemotherapy with cycle #1 beginning 10/07/2011, cycle #2 10/29/2011, cycle #3 on 11/24/2011, Cycle #4 on 12/10/2011, cycle 5 on 12/31/2011. 4. Laparoscopic-assisted ileostomy takedown on 02/10/2012 5. Restaging CTs of the chest, abdomen, and pelvis on 05/28/2012 revealed no evidence of metastatic disease. 6. Negative surveillance colonoscopy 11/05/2012 2. History of multiple colorectal polyps. 3. Allergic rhinitis. 4. History of "chest heaviness and arm-leg pain" with a "hot" feeling during the course of chemotherapy and radiation, status post a negative cardiac evaluation by Dr. Percival Spanish.  5. erectile dysfunction following rectal surgery/radiation-improved with Cialis  6. Chronic rectal urgency/frequency following  treatment for rectal cancer-now followed at the pelvic physical therapy clinic 7. History of a left breast nodule-negative ultrasound by Dr. Lucia Gaskins 04/17/2014   Disposition:  Mr. Wegner remains in clinical remission from rectal cancer. He will return for an office visit and CEA in 6 months.  He discontinued smoking approximately 3 years ago. He has a 25-30-pack-year history of smoking prior to this. I will refer him for a low-dose screening chest CT.  Betsy Coder, MD  11/25/2014  11:55 AM

## 2014-11-25 NOTE — Telephone Encounter (Signed)
gv pt appt schedule for june 2016 °

## 2014-11-26 ENCOUNTER — Telehealth: Payer: Self-pay | Admitting: *Deleted

## 2014-11-26 LAB — CEA: CEA: 0.5 ng/mL (ref 0.0–5.0)

## 2014-11-26 NOTE — Telephone Encounter (Signed)
-----   Message from [name], MD sent at 11/26/2014  1:28 PM EST ----- Please call patient, cea is normal

## 2014-11-26 NOTE — Telephone Encounter (Signed)
Per Dr. Benay Spice; notified pt that CEA is normal.  Pt verbalized understanding

## 2014-12-26 ENCOUNTER — Other Ambulatory Visit: Payer: Self-pay | Admitting: Oncology

## 2014-12-26 ENCOUNTER — Ambulatory Visit (HOSPITAL_COMMUNITY)
Admission: RE | Admit: 2014-12-26 | Discharge: 2014-12-26 | Disposition: A | Discharge status: Home / Self Care | Payer: 59 | Source: Ambulatory Visit | Attending: Oncology | Admitting: Oncology

## 2014-12-26 DIAGNOSIS — Z87891 Personal history of nicotine dependence: Secondary | ICD-10-CM

## 2014-12-26 DIAGNOSIS — Z85048 Personal history of other malignant neoplasm of rectum, rectosigmoid junction, and anus: Secondary | ICD-10-CM | POA: Insufficient documentation

## 2014-12-26 DIAGNOSIS — C20 Malignant neoplasm of rectum: Secondary | ICD-10-CM

## 2014-12-26 DIAGNOSIS — Z122 Encounter for screening for malignant neoplasm of respiratory organs: Secondary | ICD-10-CM | POA: Insufficient documentation

## 2014-12-30 ENCOUNTER — Telehealth: Payer: Self-pay | Admitting: *Deleted

## 2014-12-30 NOTE — Telephone Encounter (Signed)
Per Dr. Benay Spice; notified pt that CT is negative and will repeat in 1 year.  Pt verbalized understanding and expressed appreciation.

## 2014-12-30 NOTE — Telephone Encounter (Signed)
-----   Message from [name], MD sent at 12/29/2014  8:07 PM EST ----- Please call patient, CT is negative, repeat in 1 year

## 2015-01-08 ENCOUNTER — Telehealth: Payer: Self-pay | Admitting: Oncology

## 2015-01-08 NOTE — Telephone Encounter (Signed)
Lft msg for pt to confirm labs/ov per MD schedule, mailed sch to pt... KJ

## 2015-05-18 ENCOUNTER — Other Ambulatory Visit: Payer: 59

## 2015-05-18 ENCOUNTER — Ambulatory Visit: Payer: 59 | Admitting: Oncology

## 2015-05-18 ENCOUNTER — Ambulatory Visit (HOSPITAL_BASED_OUTPATIENT_CLINIC_OR_DEPARTMENT_OTHER): Payer: 59 | Admitting: Oncology

## 2015-05-18 ENCOUNTER — Telehealth: Payer: Self-pay | Admitting: Oncology

## 2015-05-18 ENCOUNTER — Other Ambulatory Visit (HOSPITAL_BASED_OUTPATIENT_CLINIC_OR_DEPARTMENT_OTHER): Payer: 59

## 2015-05-18 VITALS — BP 141/82 | HR 79 | Temp 98.3°F | Resp 19 | Ht 71.0 in | Wt 203.1 lb

## 2015-05-18 DIAGNOSIS — C20 Malignant neoplasm of rectum: Secondary | ICD-10-CM

## 2015-05-18 DIAGNOSIS — Z85048 Personal history of other malignant neoplasm of rectum, rectosigmoid junction, and anus: Secondary | ICD-10-CM

## 2015-05-18 NOTE — Progress Notes (Signed)
  [place] OFFICE PROGRESS NOTE   Diagnosis: Rectal cancer  INTERVAL HISTORY:   Mr. Saxton returns as scheduled. He reports a good appetite. He continues to have frequent bowel movements. No bleeding. He reports mild exertional dyspnea since completing chemotherapy. 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 He remains off of smoking. A screening chest CT on 12/26/2014 revealed a 2 mm subpleural nodule in the medial left upper lobe and mild to moderate emphysematous changes at the lung apices. Objective:  Vital signs in last 24 hours:  Blood pressure 141/82, pulse 79, temperature 98.3 F (36.8 C), temperature source Oral, resp. rate 19, height 5\' 11"  (1.803 m), weight 203 lb 1.6 oz (92.126 kg), SpO2 99 %.    HEENT: Neck without mass Lymphatics: No cervical, supra-clavicular, axillary, or inguinal nodes Resp: Lungs clear bilaterally with distant breath sounds Cardio: Regular rate and rhythm GI: No hepatosplenomegaly, nontender, no mass Vascular: No leg edema Breasts: Slight irregularity in the cutaneous fat at the left upper areola, no discrete mass      Lab Results  Component Value Date   CEA 0.5 11/25/2014   Medications: I have reviewed the patient's current medications.  Assessment/Plan: 1. Rectal cancer, clinical stage III (uT3, uN1), status post an endoscopic biopsy on 05/30/2011.  1. Initiation of concurrent Xeloda and radiation on 06/20/2011, completed on 07/27/2011. 2. Low anterior resection on 09/01/2011 with the pathology confirming a yPT1 N0 tumor. 3. Initiation of adjuvant Xeloda chemotherapy with cycle #1 beginning 10/07/2011, cycle #2 10/29/2011, cycle #3 on 11/24/2011, Cycle #4 on 12/10/2011, cycle 5 on 12/31/2011. 4. Laparoscopic-assisted ileostomy takedown on 02/10/2012 5. Restaging CTs of the chest, abdomen, and pelvis on 05/28/2012 revealed no evidence of metastatic disease. 6. Negative surveillance colonoscopy  11/05/2012 2. History of multiple colorectal polyps. 3. Allergic rhinitis. 4. History of "chest heaviness and arm-leg pain" with a "hot" feeling during the course of chemotherapy and radiation, status post a negative cardiac evaluation by Dr. Percival Spanish.  5. erectile dysfunction following rectal surgery/radiation-improved with Cialis  6. Chronic rectal urgency/frequency following treatment for rectal urgency- improved following pelvic physical therapy 7. History of a left breast nodule-negative ultrasound by Dr. Lucia Gaskins 04/17/2014    Disposition:  Mr. Offord remains in clinical remission from rectal cancer. He will return for an office visit and CEA in 6 months. He will continue yearly chest CT screening for lung cancer with his history of tobacco use.  I suspect the exertional dyspnea is related to emphysema. I recommended he see Dr. Ashby Dawes to discuss pulmonary function testing.  Betsy Coder, MD  05/18/2015  11:40 AM

## 2015-05-18 NOTE — Telephone Encounter (Signed)
Gave and printed appt sched and avs fo rpt for Dec °

## 2015-05-19 ENCOUNTER — Telehealth: Payer: Self-pay | Admitting: *Deleted

## 2015-05-19 LAB — CEA: CEA: 0.5 ng/mL (ref 0.0–5.0)

## 2015-05-19 NOTE — Telephone Encounter (Signed)
-----   Message from [name], MD sent at 05/19/2015  7:33 AM EDT ----- Please call patient, Casey [name] is normal

## 2015-05-19 NOTE — Telephone Encounter (Signed)
Called pt to make him aware of CEA results; pt appreciated call

## 2015-11-13 ENCOUNTER — Telehealth: Payer: Self-pay | Admitting: Oncology

## 2015-11-13 NOTE — Telephone Encounter (Signed)
per BS (schedule sent to [facility]) moved from 12/29 out a few weeks. Left message for patient re change with new date/time for lab/fu 1/20/017. Schedule mailed.

## 2015-11-19 ENCOUNTER — Encounter: Payer: Self-pay | Admitting: Gastroenterology

## 2015-11-19 ENCOUNTER — Encounter: Payer: Self-pay | Admitting: Oncology

## 2015-11-26 ENCOUNTER — Other Ambulatory Visit: Payer: 59

## 2015-11-26 ENCOUNTER — Ambulatory Visit: Payer: 59 | Admitting: Oncology

## 2015-12-17 ENCOUNTER — Other Ambulatory Visit: Payer: Self-pay | Admitting: *Deleted

## 2015-12-18 ENCOUNTER — Telehealth: Payer: Self-pay | Admitting: Oncology

## 2015-12-18 ENCOUNTER — Ambulatory Visit (HOSPITAL_BASED_OUTPATIENT_CLINIC_OR_DEPARTMENT_OTHER): Payer: Commercial Managed Care - HMO | Admitting: Oncology

## 2015-12-18 ENCOUNTER — Other Ambulatory Visit (HOSPITAL_BASED_OUTPATIENT_CLINIC_OR_DEPARTMENT_OTHER): Payer: Commercial Managed Care - HMO

## 2015-12-18 VITALS — BP 157/84 | HR 78 | Temp 98.3°F | Resp 18 | Ht 71.0 in | Wt 198.9 lb

## 2015-12-18 DIAGNOSIS — C20 Malignant neoplasm of rectum: Secondary | ICD-10-CM

## 2015-12-18 DIAGNOSIS — Z85048 Personal history of other malignant neoplasm of rectum, rectosigmoid junction, and anus: Secondary | ICD-10-CM

## 2015-12-18 NOTE — Telephone Encounter (Signed)
per pof to sch pt appt-gave pt copy of avs °

## 2015-12-18 NOTE — Progress Notes (Signed)
  [place] OFFICE PROGRESS NOTE   Diagnosis:  Rectal cancer  INTERVAL HISTORY:    Casey [name] returns as scheduled. He currently has a cold. He otherwise feels well. He continues to have frequent bowel movements.  He had aa colonoscopy by Dr. Earlean Shawl 10/12/2015. A polyp was removed from the right colon. The pathology revealed fragments of a tumor adenoma.    Objective:  Vital signs in last 24 hours:  Blood pressure 157/84, pulse 78, temperature 98.3 F (36.8 C), temperature source Oral, resp. rate 18, height 5\' 11"  (1.803 m), weight 198 lb 14.4 oz (90.22 kg), SpO2 97 %.    HEENT:  Neck without mass Lymphatics:  No cervical, supraclavicular, axillary, or inguinal nodes Resp:  Lungs clear bilaterally Cardio:  Regular rate and rhythm GI:  No hepatosplenomegaly, nontender , no mass Vascular:  No leg edema   Lab Results:  Lab Results  Component Value Date   WBC 12.2* 02/11/2012   HGB 13.3 02/11/2012   HCT 39.0 02/11/2012   MCV 99.2 02/11/2012   PLT 255 02/11/2012   NEUTROABS 3.8 02/07/2012    Lab Results  Component Value Date   NA 143 05/21/2012    Lab Results  Component Value Date   CEA 0.5 05/18/2015    Imaging:  No results found.  Medications: I have reviewed the patient's current medications.  Assessment/Plan: 1. Rectal cancer, clinical stage III (uT3, uN1), status post an endoscopic biopsy on 05/30/2011.  1. Initiation of concurrent Xeloda and radiation on 06/20/2011, completed on 07/27/2011. 2. Low anterior resection on 09/01/2011 with the pathology confirming a yPT1 N0 tumor. 3. Initiation of adjuvant Xeloda chemotherapy with cycle #1 beginning 10/07/2011, cycle #2 10/29/2011, cycle #3 on 11/24/2011, Cycle #4 on 12/10/2011, cycle 5 on 12/31/2011. 4. Laparoscopic-assisted ileostomy takedown on 02/10/2012 5. Restaging CTs of the chest, abdomen, and pelvis on 05/28/2012 revealed no evidence of metastatic disease. 6. Negative  surveillance colonoscopy 11/05/2012   colonoscopy November 2014 -tubular adenoma removed from the right colon 2. History of multiple colorectal polyps. 3. Allergic rhinitis. 4. History of "chest heaviness and arm-leg pain" with a "hot" feeling during the course of chemotherapy and radiation, status post a negative cardiac evaluation by Dr. Percival Spanish.  5. erectile dysfunction following rectal surgery/radiation-improved with Cialis  6. Chronic rectal urgency/frequency following treatment for rectal urgency- improved following pelvic physical therapy 7. History of a left breast nodule-negative ultrasound by Dr. Lucia Gaskins 04/17/2014    Disposition:   Casey [name] remains in clinical remission from rectal cancer. He will return for an office visit and CEA in 6 months. We will follow-up on the CEA from today.   He will continue surveillance colonoscopies with Dr. Earlean Shawl.   He is scheduled for a screening chest CT within the next few weeks.  Betsy Coder, MD  12/18/2015  11:19 AM

## 2015-12-19 LAB — CEA (PARALLEL TESTING): CEA: <0.5 ng/mL (ref 0.0–5.0)

## 2015-12-19 LAB — CEA: CEA: 1.6 ng/mL (ref 0.0–4.7)

## 2015-12-21 ENCOUNTER — Telehealth: Payer: Self-pay | Admitting: *Deleted

## 2015-12-21 NOTE — Telephone Encounter (Signed)
Per Dr. [name]; left voice message that cea is normal; call office if questions. 

## 2015-12-21 NOTE — Telephone Encounter (Signed)
-----   Message from [name], MD sent at 12/19/2015  3:10 PM EST ----- Please call patient, cea is normal

## 2015-12-30 ENCOUNTER — Ambulatory Visit (HOSPITAL_COMMUNITY)
Admission: RE | Admit: 2015-12-30 | Discharge: 2015-12-30 | Disposition: A | Discharge status: Home / Self Care | Payer: Commercial Managed Care - HMO | Source: Ambulatory Visit | Attending: Oncology | Admitting: Oncology

## 2015-12-30 DIAGNOSIS — C20 Malignant neoplasm of rectum: Secondary | ICD-10-CM

## 2016-01-08 ENCOUNTER — Ambulatory Visit (HOSPITAL_COMMUNITY): Payer: Commercial Managed Care - HMO

## 2016-01-08 ENCOUNTER — Telehealth: Payer: Self-pay | Admitting: Oncology

## 2016-01-08 NOTE — Telephone Encounter (Signed)
Patient left message re r/s ct scan appointment. Per patient he had a cold - cough and was on antibiotics at the time the test was 1st scheduled. Per patient after being asked a series of questions because of his symptoms test had to be r/s. Per patient he was the scheduled for 2/10 and because of the same issues he was not able to have test. Patient message forwarded to desk nurse re issues for why test was not able to be completed twice. Returned call and provided number for patient to contact central radiology scheduling set up a date/time for ct to be rescheduled and asked patient to call to speak with BS desk nurse if test is not able to be completed due to the issue described above. Patient's message forwarded to desk nurse.

## 2016-01-11 ENCOUNTER — Telehealth: Payer: Self-pay | Admitting: *Deleted

## 2016-01-11 NOTE — Telephone Encounter (Signed)
Returned call to pt with reminder to contact central scheduling to reschedule chest CT. He voiced understanding.

## 2016-01-18 ENCOUNTER — Ambulatory Visit (HOSPITAL_COMMUNITY)
Admission: RE | Admit: 2016-01-18 | Discharge: 2016-01-18 | Disposition: A | Discharge status: Home / Self Care | Payer: Commercial Managed Care - HMO | Source: Ambulatory Visit | Attending: Oncology | Admitting: Oncology

## 2016-01-18 DIAGNOSIS — Z85118 Personal history of other malignant neoplasm of bronchus and lung: Secondary | ICD-10-CM | POA: Diagnosis not present

## 2016-01-18 DIAGNOSIS — I2510 Atherosclerotic heart disease of native coronary artery without angina pectoris: Secondary | ICD-10-CM | POA: Diagnosis not present

## 2016-01-18 DIAGNOSIS — K8020 Calculus of gallbladder without cholecystitis without obstruction: Secondary | ICD-10-CM | POA: Diagnosis not present

## 2016-01-18 DIAGNOSIS — Z08 Encounter for follow-up examination after completed treatment for malignant neoplasm: Secondary | ICD-10-CM | POA: Insufficient documentation

## 2016-01-18 DIAGNOSIS — J439 Emphysema, unspecified: Secondary | ICD-10-CM | POA: Insufficient documentation

## 2016-01-19 ENCOUNTER — Telehealth: Payer: Self-pay | Admitting: *Deleted

## 2016-01-19 NOTE — Telephone Encounter (Addendum)
Per Dr. Benay Spice, pt notified that CT chest is negative for cancer.  Pt is appreciative of phone call.  CT results routed to pt.'s PCP-Dr. Ashby Dawes per order of Dr. Benay Spice.

## 2016-06-03 ENCOUNTER — Ambulatory Visit (HOSPITAL_BASED_OUTPATIENT_CLINIC_OR_DEPARTMENT_OTHER): Payer: Commercial Managed Care - HMO | Admitting: Nurse Practitioner

## 2016-06-03 ENCOUNTER — Other Ambulatory Visit: Payer: Commercial Managed Care - HMO

## 2016-06-03 ENCOUNTER — Telehealth: Payer: Self-pay | Admitting: Oncology

## 2016-06-03 VITALS — BP 141/86 | HR 74 | Temp 98.3°F | Resp 18 | Ht 71.0 in | Wt 204.7 lb

## 2016-06-03 DIAGNOSIS — C20 Malignant neoplasm of rectum: Secondary | ICD-10-CM

## 2016-06-03 DIAGNOSIS — Z85048 Personal history of other malignant neoplasm of rectum, rectosigmoid junction, and anus: Secondary | ICD-10-CM

## 2016-06-03 NOTE — Telephone Encounter (Signed)
Gave and printed appt sched and avs for pt for July 2018 °

## 2016-06-03 NOTE — Progress Notes (Addendum)
  [place] OFFICE PROGRESS NOTE   Diagnosis:  Rectal cancer  INTERVAL HISTORY:   Mr. Addicks returns as scheduled. He continues to have frequent bowel movements. He rarely notes a small amount of blood after a bowel movement. He reports his next colonoscopy is scheduled 2018. No abdominal pain. He has a good appetite. He quit smoking in 2012 at 1 pack per day for 25 years.  Objective:  Vital signs in last 24 hours:  Blood pressure 141/86, pulse 74, temperature 98.3 F (36.8 C), temperature source Oral, resp. rate 18, height 5\' 11"  (1.803 m), weight 204 lb 11.2 oz (92.851 kg), SpO2 98 %.    HEENT: No thrush or ulcers. Lymphatics: No palpable cervical, supraclavicular, axillary or inguinal lymph nodes. Resp: Lungs clear bilaterally. Cardio: Regular rate and rhythm. GI: Abdomen soft and nontender. No organomegaly. No mass. Vascular: No leg edema.  Lab Results:  Lab Results  Component Value Date   WBC 12.2* 02/11/2012   HGB 13.3 02/11/2012   HCT 39.0 02/11/2012   MCV 99.2 02/11/2012   PLT 255 02/11/2012   NEUTROABS 3.8 02/07/2012    Imaging:  No results found.  Medications: I have reviewed the patient's current medications.  Assessment/Plan: 1. Rectal cancer, clinical stage III (uT3, uN1), status post an endoscopic biopsy on 05/30/2011.  1. Initiation of concurrent Xeloda and radiation on 06/20/2011, completed on 07/27/2011. 2. Low anterior resection on 09/01/2011 with the pathology confirming a yPT1 N0 tumor. 3. Initiation of adjuvant Xeloda chemotherapy with cycle #1 beginning 10/07/2011, cycle #2 10/29/2011, cycle #3 on 11/24/2011, Cycle #4 on 12/10/2011, cycle 5 on 12/31/2011. 4. Laparoscopic-assisted ileostomy takedown on 02/10/2012 5. Restaging CTs of the chest, abdomen, and pelvis on 05/28/2012 revealed no evidence of metastatic disease. 6. Negative surveillance colonoscopy 11/05/2012  colonoscopy November 2016 -tubular adenoma removed from  the right colon 2. History of multiple colorectal polyps. 3. Allergic rhinitis. 4. History of "chest heaviness and arm-leg pain" with a "hot" feeling during the course of chemotherapy and radiation, status post a negative cardiac evaluation by Dr. Percival Spanish.  5. erectile dysfunction following rectal surgery/radiation-improved with Cialis  6. Chronic rectal urgency/frequency following treatment for rectal urgency- improved following pelvic physical therapy 7. History of a left breast nodule-negative ultrasound by Dr. Lucia Gaskins 04/17/2014   Disposition: Mr. Serrette remains in clinical remission from rectal cancer. We will follow-up on the CEA from today. He is now 5 years out from the initial diagnosis. We discussed discharge versus annual follow-up. He prefers to return annually. He will return for a follow-up visit and CEA in one year.  He will be due for a screening chest CT February 2018. We sent the order to radiology.  Patient seen with Dr. Benay Spice.   Ned Card ANP/GNP-BC   06/03/2016  10:18 AM  This was a shared visit with Ned Card. Mr. Aujla remains in clinical remission from rectal cancer. He will return for an office visit in one year.  Julieanne Manson, M.D.

## 2016-06-04 LAB — CEA: CEA: 1.6 ng/mL (ref 0.0–4.7)

## 2016-06-04 LAB — CEA (PARALLEL TESTING): CEA: <0.5 ng/mL

## 2016-06-06 ENCOUNTER — Telehealth: Payer: Self-pay | Admitting: *Deleted

## 2016-06-06 NOTE — Telephone Encounter (Signed)
-----   Message from [name], MD sent at 06/04/2016  9:10 AM EDT ----- Please call patient, cea is normal

## 2017-01-02 ENCOUNTER — Telehealth: Payer: Self-pay | Admitting: *Deleted

## 2017-01-02 NOTE — Telephone Encounter (Signed)
Message from pt, he has not heard from radiology to schedule CT this month. Message sent to managed care for prior auth.

## 2017-01-19 ENCOUNTER — Telehealth: Payer: Self-pay | Admitting: *Deleted

## 2017-01-19 NOTE — Telephone Encounter (Signed)
Message from pt calling to follow up on chest CT. Prior Josem Kaufmann has been done. Called central scheduling, requested they contact pt.

## 2017-01-25 ENCOUNTER — Ambulatory Visit (HOSPITAL_COMMUNITY)
Admission: RE | Admit: 2017-01-25 | Discharge: 2017-01-25 | Disposition: A | Discharge status: Home / Self Care | Payer: Commercial Managed Care - HMO | Source: Ambulatory Visit | Attending: Nurse Practitioner | Admitting: Nurse Practitioner

## 2017-01-25 DIAGNOSIS — C20 Malignant neoplasm of rectum: Secondary | ICD-10-CM | POA: Diagnosis not present

## 2017-01-25 DIAGNOSIS — Z87891 Personal history of nicotine dependence: Secondary | ICD-10-CM | POA: Diagnosis not present

## 2017-02-01 ENCOUNTER — Telehealth: Payer: Self-pay | Admitting: *Deleted

## 2017-02-01 NOTE — Telephone Encounter (Signed)
Message left on patient's private cell phone to inform him per order of Dr. Benay Spice that CT shows no evidence of cancer and to continue yearly screening chest CT.  Patient instructed to call [place] if he has any questions or concerns.

## 2017-02-01 NOTE — Telephone Encounter (Signed)
-----   Message from [name], MD sent at 01/31/2017  6:21 PM EST ----- Please call patient, CT with evidence of cancer, continue yearly screening chest CT

## 2017-05-21 ENCOUNTER — Telehealth: Payer: Self-pay | Admitting: Oncology

## 2017-05-21 NOTE — Telephone Encounter (Signed)
Lvm advising appt 7/13 moved to 8/9 @ 11.30am due to md pal.

## 2017-05-22 ENCOUNTER — Encounter: Payer: Self-pay | Admitting: *Deleted

## 2017-05-22 NOTE — Progress Notes (Signed)
[place] Social Work  Clinical Social Work was referred by patient to review and complete healthcare advance directives.  Clinical Social Worker met with patient and patients wife in [place] office.  The patient designated Joshaua Epple as their primary healthcare agent and Lavarius Doughten as their secondary agent.  Patient also completed healthcare living will.    Clinical Social Worker notarized documents and made copies for patient/family. Clinical Social Worker will send documents to medical records to be scanned into patient's chart. Clinical Social Worker encouraged patient/family to contact with any additional questions or concerns.  Johnnye Lana, MSW, LCSW, OSW-C Clinical Social Worker [facility] [phone]

## 2017-06-09 ENCOUNTER — Other Ambulatory Visit: Payer: Commercial Managed Care - HMO

## 2017-06-09 ENCOUNTER — Ambulatory Visit: Payer: Commercial Managed Care - HMO | Admitting: Oncology

## 2017-07-06 ENCOUNTER — Other Ambulatory Visit (HOSPITAL_BASED_OUTPATIENT_CLINIC_OR_DEPARTMENT_OTHER): Payer: 59

## 2017-07-06 ENCOUNTER — Ambulatory Visit (HOSPITAL_BASED_OUTPATIENT_CLINIC_OR_DEPARTMENT_OTHER): Payer: 59 | Admitting: Oncology

## 2017-07-06 ENCOUNTER — Telehealth: Payer: Self-pay | Admitting: Oncology

## 2017-07-06 VITALS — BP 132/82 | HR 67 | Temp 97.7°F | Resp 18 | Ht 71.0 in | Wt 219.4 lb

## 2017-07-06 DIAGNOSIS — Z85048 Personal history of other malignant neoplasm of rectum, rectosigmoid junction, and anus: Secondary | ICD-10-CM

## 2017-07-06 DIAGNOSIS — C801 Malignant (primary) neoplasm, unspecified: Secondary | ICD-10-CM

## 2017-07-06 DIAGNOSIS — C20 Malignant neoplasm of rectum: Secondary | ICD-10-CM

## 2017-07-06 LAB — CEA (IN HOUSE-CHCC): CEA (CHCC-In House): <1 ng/mL (ref 0.00–5.00)

## 2017-07-06 NOTE — Progress Notes (Signed)
  [place] OFFICE PROGRESS NOTE   Diagnosis: Rectal cancer  INTERVAL HISTORY:   Casey [name] returns as scheduled. He feels well. He continues to have irregular bowel habits. He has intermittent discomfort in the right lower abdomen. Good appetite. His wife has been diagnosed with a glioblastoma.  A screening chest CT 01/25/2017 was negative.  Objective:  Vital signs in last 24 hours:  Blood pressure 132/82, pulse 67, temperature 97.7 F (36.5 C), temperature source Oral, resp. rate 18, height 5\' 11"  (1.803 m), weight 219 lb 6.4 oz (99.5 kg), SpO2 99 %.    HEENT: Neck without mass Lymphatics: No cervical, supraclavicular, right axillary, or inguinal nodes. Soft mobile 1/2 cm left axillary node versus a prominent fat pad Resp: Lungs clear bilaterally Cardio: Regular rate and rhythm GI: No hepatomegaly, nontender, no mass Vascular: No leg edema, the left lower leg is slightly larger than the right side   Lab Results:   Lab Results  Component Value Date   CEA1 1.6 06/03/2016     Medications: I have reviewed the patient's current medications.  Assessment/Plan: 1. Rectal cancer, clinical stage III (uT3, uN1), status post an endoscopic biopsy on 05/30/2011.  1. Initiation of concurrent Xeloda and radiation on 06/20/2011, completed on 07/27/2011. 2. Low anterior resection on 09/01/2011 with the pathology confirming a yPT1 N0 tumor. 3. Initiation of adjuvant Xeloda chemotherapy with cycle #1 beginning 10/07/2011, cycle #2 10/29/2011, cycle #3 on 11/24/2011, Cycle #4 on 12/10/2011, cycle 5 on 12/31/2011. 4. Laparoscopic-assisted ileostomy takedown on 02/10/2012 5. Restaging CTs of the chest, abdomen, and pelvis on 05/28/2012 revealed no evidence of metastatic disease. 6. Negative surveillance colonoscopy 11/05/2012  colonoscopy November 2016 -tubular adenoma removed from the right colon 2. History of multiple colorectal polyps. 3. Allergic rhinitis. 4.  History of "chest heaviness and arm-leg pain" with a "hot" feeling during the course of chemotherapy and radiation, status post a negative cardiac evaluation by Dr. Percival Spanish.  5. erectile dysfunction following rectal surgery/radiation-improved with Cialis  6. Chronic rectal urgency/frequency following treatment for rectal urgency- improved following pelvic physical therapy 7. History of a left breast nodule-negative ultrasound by Dr. Lucia Gaskins 04/17/2014 8.  History of tobacco use-he undergoes yearly screening chest CTs     Disposition:  Casey [name] remains in clinical remission from rectal cancer. He would like to continue follow-up at the [facility]. He continues surveillance colonoscopy with Dr. Earlean Shawl.  We will follow-up on the CEA from today. He will return for an office visit in one year.  15 minutes were spent with the patient today. The majority of the time was used for counseling and coordination of care.  Donneta Romberg, MD  07/06/2017  12:42 PM

## 2017-07-06 NOTE — Telephone Encounter (Signed)
Scheduled appt per 8/9 los - Gave patient AVS and calender per los. Central radiology to contact patient with CT

## 2017-07-07 ENCOUNTER — Telehealth: Payer: Self-pay | Admitting: *Deleted

## 2017-07-07 LAB — CEA: CEA: 1.4 ng/mL (ref 0.0–4.7)

## 2017-07-07 NOTE — Telephone Encounter (Signed)
-----   Message from [name], MD sent at 07/06/2017  5:20 PM EDT ----- Please call patient, Casey [name] is normal

## 2017-07-07 NOTE — Telephone Encounter (Signed)
Telephone call to patient- left message to return call in regards to lab results as directed below.

## 2017-10-26 ENCOUNTER — Encounter: Payer: Self-pay | Admitting: Nurse Practitioner

## 2017-10-26 ENCOUNTER — Telehealth: Payer: Self-pay | Admitting: *Deleted

## 2017-10-26 NOTE — Telephone Encounter (Signed)
Left message on voicemail informing pt his jury excusal letter is available for pick up. Requested he call back to let us know if he wants it mailed directly to courthouse.

## 2017-10-27 NOTE — Telephone Encounter (Signed)
Pt returned call, he would like letter mailed directly to courthouse, also requests to pick up a copy at front desk.  Letter mailed and copy left for pick up.

## 2018-02-02 ENCOUNTER — Ambulatory Visit (HOSPITAL_COMMUNITY)
Admission: RE | Admit: 2018-02-02 | Discharge: 2018-02-02 | Disposition: A | Discharge status: Home / Self Care | Payer: 59 | Source: Ambulatory Visit | Attending: Oncology | Admitting: Oncology

## 2018-02-02 DIAGNOSIS — J439 Emphysema, unspecified: Secondary | ICD-10-CM | POA: Insufficient documentation

## 2018-02-02 DIAGNOSIS — K8020 Calculus of gallbladder without cholecystitis without obstruction: Secondary | ICD-10-CM | POA: Diagnosis not present

## 2018-02-02 DIAGNOSIS — C801 Malignant (primary) neoplasm, unspecified: Secondary | ICD-10-CM | POA: Diagnosis present

## 2018-02-02 DIAGNOSIS — I2510 Atherosclerotic heart disease of native coronary artery without angina pectoris: Secondary | ICD-10-CM | POA: Diagnosis not present

## 2018-02-02 DIAGNOSIS — Z122 Encounter for screening for malignant neoplasm of respiratory organs: Secondary | ICD-10-CM | POA: Diagnosis not present

## 2018-02-02 DIAGNOSIS — I700 Atherosclerosis of aorta: Secondary | ICD-10-CM | POA: Diagnosis not present

## 2018-02-02 DIAGNOSIS — C20 Malignant neoplasm of rectum: Secondary | ICD-10-CM | POA: Diagnosis not present

## 2018-02-06 ENCOUNTER — Telehealth: Payer: Self-pay

## 2018-02-06 NOTE — Telephone Encounter (Addendum)
Pt voiced understanding of message below   ----- Message from [name], MD sent at 02/04/2018 10:13 PM EDT ----- Please call patient, CT is negative for cancer, continue yearly screening CT

## 2018-05-14 ENCOUNTER — Telehealth: Payer: Self-pay

## 2018-05-14 NOTE — Telephone Encounter (Signed)
Did not print avs (ERROR).

## 2018-05-14 NOTE — Telephone Encounter (Signed)
Printed avs and calender of up coming appointment. Per 6/17patient request walk in

## 2018-07-06 ENCOUNTER — Telehealth: Payer: Self-pay

## 2018-07-06 ENCOUNTER — Inpatient Hospital Stay: Payer: 59 | Attending: Oncology | Admitting: Oncology

## 2018-07-06 ENCOUNTER — Inpatient Hospital Stay: Payer: 59

## 2018-07-06 ENCOUNTER — Encounter: Payer: Self-pay | Admitting: Oncology

## 2018-07-06 VITALS — BP 157/89 | HR 83 | Temp 98.3°F | Resp 18 | Ht 71.0 in | Wt 216.6 lb

## 2018-07-06 DIAGNOSIS — Z87891 Personal history of nicotine dependence: Secondary | ICD-10-CM

## 2018-07-06 DIAGNOSIS — C20 Malignant neoplasm of rectum: Secondary | ICD-10-CM

## 2018-07-06 DIAGNOSIS — C801 Malignant (primary) neoplasm, unspecified: Secondary | ICD-10-CM

## 2018-07-06 DIAGNOSIS — Z85048 Personal history of other malignant neoplasm of rectum, rectosigmoid junction, and anus: Secondary | ICD-10-CM

## 2018-07-06 DIAGNOSIS — Z86010 Personal history of colonic polyps: Secondary | ICD-10-CM

## 2018-07-06 DIAGNOSIS — Z9221 Personal history of antineoplastic chemotherapy: Secondary | ICD-10-CM

## 2018-07-06 DIAGNOSIS — Z923 Personal history of irradiation: Secondary | ICD-10-CM | POA: Diagnosis not present

## 2018-07-06 LAB — CEA (IN HOUSE-CHCC): CEA (CHCC-In House): <1 ng/mL (ref 0.00–5.00)

## 2018-07-06 NOTE — Progress Notes (Signed)
  [name] OFFICE PROGRESS NOTE   Diagnosis: Rectal cancer  INTERVAL HISTORY:   Casey [name] returns for a scheduled visit.  He underwent a screening chest CT in March.  No evidence of malignancy.  He feels well.  He continues to have irregular bowel habits.  He underwent a colonoscopy earlier this year.  Objective:  Vital signs in last 24 hours:  Blood pressure (!) 157/89, pulse 83, temperature 98.3 F (36.8 C), temperature source Oral, resp. rate 18, height 5\' 11"  (1.803 m), weight 216 lb 9.6 oz (98.2 kg), SpO2 98 %.    HEENT: Neck without mass Lymphatics: No cervical, supraclavicular, axillary, or inguinal nodes Resp: Lungs clear bilaterally Cardio: Regular rate and rhythm GI: No hepatospleno megaly, no mass, nontender, firm scar tissue at the low mid abdomen Vascular: No leg edema, the left lower leg is slightly larger than the right side  Lab Results:   Lab Results  Component Value Date   CEA1 <1.00 07/06/2017   CEA1 1.4 07/06/2017     Medications: I have reviewed the patient's current medications.   Assessment/Plan: 1. Rectal cancer, clinical stage III (uT3, uN1), status post an endoscopic biopsy on 05/30/2011.  1. Initiation of concurrent Xeloda and radiation on 06/20/2011, completed on 07/27/2011. 2. Low anterior resection on 09/01/2011 with the pathology confirming a yPT1 N0 tumor. 3. Initiation of adjuvant Xeloda chemotherapy with cycle #1 beginning 10/07/2011, cycle #2 10/29/2011, cycle #3 on 11/24/2011, Cycle #4 on 12/10/2011, cycle 5 on 12/31/2011. 4. Laparoscopic-assisted ileostomy takedown on 02/10/2012 5. Restaging CTs of the chest, abdomen, and pelvis on 05/28/2012 revealed no evidence of metastatic disease. 6. Negative surveillance colonoscopy 11/05/2012  colonoscopy November 2016 -tubular adenoma removed from the right colon 2. History of multiple colorectal polyps. 3. Allergic rhinitis. 4. History of "chest heaviness and arm-leg  pain" with a "hot" feeling during the course of chemotherapy and radiation, status post a negative cardiac evaluation by Dr. Percival Spanish.  5. erectile dysfunction following rectal surgery/radiation-improved with Cialis  6. Chronic rectal urgency/frequency following treatment for rectal urgency-improved following pelvic physical therapy 7. History of a left breast nodule-negative ultrasound by Dr. Lucia Gaskins 04/17/2014 8.  History of tobacco use-he undergoes yearly screening chest CTs    Disposition: Casey [name] is in clinical remission from rectal cancer.  He would like to continue follow-up at the Cancer center.  He will return for an office visit in 1 year.  We will follow-up on the CEA from today.  He we will continue yearly lung cancer screening CT.  He continues follow-up with Dr. Lucia Gaskins.  He will continue surveillance colonoscopies with Dr. Earlean Shawl.  15 minutes were spent with the patient today.  The majority of the time was used for counseling and coordination of care.  Betsy Coder, MD  07/06/2018  10:57 AM

## 2018-07-06 NOTE — Telephone Encounter (Signed)
Printed avs and calender of upcoming appointment. per 8/9 los.

## 2018-07-09 ENCOUNTER — Telehealth: Payer: Self-pay | Admitting: Emergency Medicine

## 2018-07-09 NOTE — Telephone Encounter (Signed)
-----   Message from [name], MD sent at 07/06/2018  4:43 PM EDT ----- Please call patient, Casey [name] is normal

## 2019-07-05 ENCOUNTER — Telehealth: Payer: Self-pay | Admitting: Oncology

## 2019-07-05 ENCOUNTER — Inpatient Hospital Stay: Payer: 59 | Attending: Oncology | Admitting: Oncology

## 2019-07-05 ENCOUNTER — Other Ambulatory Visit: Payer: Self-pay

## 2019-07-05 VITALS — BP 155/90 | HR 96 | Temp 98.5°F | Resp 18 | Ht 71.0 in | Wt 213.5 lb

## 2019-07-05 DIAGNOSIS — N529 Male erectile dysfunction, unspecified: Secondary | ICD-10-CM | POA: Diagnosis not present

## 2019-07-05 DIAGNOSIS — Z85048 Personal history of other malignant neoplasm of rectum, rectosigmoid junction, and anus: Secondary | ICD-10-CM | POA: Insufficient documentation

## 2019-07-05 DIAGNOSIS — Z87891 Personal history of nicotine dependence: Secondary | ICD-10-CM | POA: Insufficient documentation

## 2019-07-05 DIAGNOSIS — Z9221 Personal history of antineoplastic chemotherapy: Secondary | ICD-10-CM | POA: Diagnosis not present

## 2019-07-05 DIAGNOSIS — C20 Malignant neoplasm of rectum: Secondary | ICD-10-CM | POA: Diagnosis not present

## 2019-07-05 DIAGNOSIS — Z79899 Other long term (current) drug therapy: Secondary | ICD-10-CM | POA: Diagnosis not present

## 2019-07-05 DIAGNOSIS — Z923 Personal history of irradiation: Secondary | ICD-10-CM | POA: Diagnosis not present

## 2019-07-05 NOTE — Progress Notes (Signed)
  [name]   Diagnosis: Rectal cancer  INTERVAL HISTORY:   [name] returns for a scheduled visit.  Good appetite.  He continues to have frequent bowel movements, but the frequency has diminished compared to several years ago.  He has a small amount of blood occasionally when the bowel movements are more frequent.  He is followed by Dr. Murvin Natal for surveillance colonoscopy. He has stable exertional dyspnea.  Objective:  Vital signs in last 24 hours:  Blood pressure (!) 155/90, pulse 96, temperature 98.5 F (36.9 C), temperature source Oral, resp. rate 18, height 5\' 11"  (1.803 m), SpO2 98 %.    HEENT: Neck without mass Lymphatics: No cervical, supraclavicular, axillary, or inguinal nodes GI: No mass, nontender, no hepatosplenomegaly Vascular: No leg edema    Lab Results:    Lab Results  Component Value Date   CEA1 <1.00 07/06/2018     Medications: I have reviewed the patient's current medications.   Assessment/Plan: 1. Rectal cancer, clinical stage III (uT3, uN1), status post an endoscopic biopsy on 05/30/2011.  1. Initiation of concurrent Xeloda and radiation on 06/20/2011, completed on 07/27/2011. 2. Low anterior resection on 09/01/2011 with the pathology confirming a yPT1 N0 tumor. 3. Initiation of adjuvant Xeloda chemotherapy with cycle #1 beginning 10/07/2011, cycle #2 10/29/2011, cycle #3 on 11/24/2011, Cycle #4 on 12/10/2011, cycle 5 on 12/31/2011. 4. Laparoscopic-assisted ileostomy takedown on 02/10/2012 5. Restaging CTs of the chest, abdomen, and pelvis on 05/28/2012 revealed no evidence of metastatic disease. 6. Negative surveillance colonoscopy 11/05/2012  colonoscopy November 2016 -tubular adenoma removed from the right colon 2. History of multiple colorectal polyps. 3. Allergic rhinitis. 4. History of "chest heaviness and arm-leg pain" with a "hot" feeling during the course of chemotherapy and radiation, status  post a negative cardiac evaluation by Dr. Percival [name].  5. erectile dysfunction following rectal surgery/radiation-improved with Cialis  6. Chronic rectal urgency/frequency following treatment for rectal urgency-improved following pelvic physical therapy 7. History of a left breast nodule-negative ultrasound by Dr. Lucia Gaskins 04/17/2014 8.  History of tobacco use-he undergoes yearly screening chest CTs   Disposition: Casey [name] remains in clinical remission from rectal cancer. Continue follow-up at the Cancer center.  He will return for an office visit in 1 year.  He is due for a screening chest CT.  We will schedule this. He will continue colonoscopy surveillancwith Dr. Earlean Shawl.  Betsy Coder, MD  07/05/2019  12:09 PM

## 2019-07-05 NOTE — Telephone Encounter (Signed)
Scheduled per 08/07 los, patient received avs and calender.

## 2019-07-15 ENCOUNTER — Telehealth: Payer: Self-pay | Admitting: *Deleted

## 2019-07-15 NOTE — Telephone Encounter (Signed)
Informed patient that he is due this year's low dose CT chest scan and that MD has placed order. He should be getting a call soon to schedule. He understands and agrees.

## 2019-07-22 ENCOUNTER — Ambulatory Visit (HOSPITAL_COMMUNITY)
Admission: RE | Admit: 2019-07-22 | Discharge: 2019-07-22 | Disposition: A | Discharge status: Home / Self Care | Payer: 59 | Source: Ambulatory Visit | Attending: Oncology | Admitting: Oncology

## 2019-07-22 ENCOUNTER — Encounter (HOSPITAL_COMMUNITY): Payer: Self-pay

## 2019-07-22 ENCOUNTER — Other Ambulatory Visit: Payer: Self-pay

## 2019-07-22 DIAGNOSIS — C20 Malignant neoplasm of rectum: Secondary | ICD-10-CM | POA: Diagnosis not present

## 2019-07-23 ENCOUNTER — Telehealth: Payer: Self-pay | Admitting: *Deleted

## 2019-07-23 NOTE — Telephone Encounter (Signed)
-----   Message from [name], MD sent at 07/22/2019  5:31 PM EDT ----- Please call patient, CT shows tiny lung nodules, no suspicion of malignancy, plan to repeat a CT in 1 year

## 2019-07-23 NOTE — Telephone Encounter (Signed)
Per Dr. Benay Spice, pt called, vmail left with CT results. Advised pt to call cancer center with any other concerns.

## 2019-07-24 ENCOUNTER — Telehealth: Payer: Self-pay | Admitting: *Deleted

## 2019-07-24 NOTE — Telephone Encounter (Signed)
Spoke with Casey [name] and informed Casey [name] of CT scan results below as per Dr. Gearldine Shown instructions.  Casey [name] voiced understanding.

## 2019-07-24 NOTE — Telephone Encounter (Signed)
-----   Message from [name], MD sent at 07/22/2019  5:31 PM EDT ----- Please call patient, CT shows tiny lung nodules, no suspicion of malignancy, plan to repeat a CT in 1 year

## 2020-07-06 ENCOUNTER — Telehealth: Payer: Self-pay | Admitting: Physician Assistant

## 2020-07-06 ENCOUNTER — Inpatient Hospital Stay: Payer: 59 | Admitting: Oncology

## 2020-07-06 NOTE — Telephone Encounter (Signed)
Rescheduled appts per 8/9 print out with hand written instructions. Pt confirmed new appt date and time.

## 2020-07-28 ENCOUNTER — Telehealth: Payer: Self-pay | Admitting: *Deleted

## 2020-07-28 NOTE — Telephone Encounter (Signed)
Called to report he has an appointment here on 07/30/20. States he has had cold symptoms with cough, headache, runny nose, but no fever. Symptoms are improving. Asking if he should come in or reschedule.  Informed him it would be best to reschedule. Appointment moved to 09/01/20 at 11:00. Patient aware.

## 2020-07-30 ENCOUNTER — Inpatient Hospital Stay: Payer: 59 | Admitting: Oncology

## 2020-09-01 ENCOUNTER — Other Ambulatory Visit: Payer: Self-pay

## 2020-09-01 ENCOUNTER — Inpatient Hospital Stay: Payer: 59 | Attending: Oncology | Admitting: Oncology

## 2020-09-01 ENCOUNTER — Telehealth: Payer: Self-pay | Admitting: Oncology

## 2020-09-01 VITALS — BP 131/85 | HR 79 | Temp 97.6°F | Resp 16 | Ht 71.0 in | Wt 212.2 lb

## 2020-09-01 DIAGNOSIS — Z85048 Personal history of other malignant neoplasm of rectum, rectosigmoid junction, and anus: Secondary | ICD-10-CM | POA: Insufficient documentation

## 2020-09-01 DIAGNOSIS — Z923 Personal history of irradiation: Secondary | ICD-10-CM | POA: Insufficient documentation

## 2020-09-01 DIAGNOSIS — Z9221 Personal history of antineoplastic chemotherapy: Secondary | ICD-10-CM | POA: Insufficient documentation

## 2020-09-01 DIAGNOSIS — N529 Male erectile dysfunction, unspecified: Secondary | ICD-10-CM | POA: Insufficient documentation

## 2020-09-01 DIAGNOSIS — J309 Allergic rhinitis, unspecified: Secondary | ICD-10-CM | POA: Diagnosis not present

## 2020-09-01 DIAGNOSIS — C20 Malignant neoplasm of rectum: Secondary | ICD-10-CM

## 2020-09-01 DIAGNOSIS — Z87891 Personal history of nicotine dependence: Secondary | ICD-10-CM | POA: Insufficient documentation

## 2020-09-01 NOTE — Progress Notes (Signed)
  [place] OFFICE PROGRESS NOTE   Diagnosis: Rectal cancer  INTERVAL HISTORY:   Casey [name] returns as scheduled.  He feels well.  He continues to have irregular bowel habits.  He has approximately 6 bowel movements per day.  No bleeding.  No other complaint.  Objective:  Vital signs in last 24 hours:  Blood pressure 131/85, pulse 79, temperature 97.6 F (36.4 C), temperature source Tympanic, resp. rate 16, height 5\' 11"  (1.803 m), weight 212 lb 3.2 oz (96.3 kg), SpO2 98 %.    Lymphatics: No cervical, supraclavicular, axillary, or inguinal nodes Resp: Lungs clear bilaterally Cardio: Regular rate and rhythm GI: No hepatosplenomegaly, no mass, nontender Vascular: No leg edema     Lab Results:  Lab Results  Component Value Date   WBC 12.2 (H) 02/11/2012   HGB 13.3 02/11/2012   HCT 39.0 02/11/2012   MCV 99.2 02/11/2012   PLT 255 02/11/2012   NEUTROABS 3.8 02/07/2012    CMP  Lab Results  Component Value Date   NA 143 05/21/2012   K 3.9 05/21/2012   CL 105 05/21/2012   CO2 28 05/21/2012   GLUCOSE 109 (H) 05/21/2012   BUN 18 05/21/2012   CREATININE 1.18 05/21/2012   CALCIUM 9.0 05/21/2012   PROT 6.4 05/21/2012   ALBUMIN 4.1 05/21/2012   AST 14 05/21/2012   ALT 11 05/21/2012   ALKPHOS 50 05/21/2012   BILITOT 0.6 05/21/2012   GFRNONAA 77 (L) 02/11/2012   GFRAA 89 (L) 02/11/2012    Lab Results  Component Value Date   CEA1 <1.00 07/06/2018     Medications: I have reviewed the patient's current medications.   Assessment/Plan:  1. Rectal cancer, clinical stage III (uT3, uN1), status post an endoscopic biopsy on 05/30/2011.   Initiation of concurrent Xeloda and radiation on 06/20/2011, completed on 07/27/2011.  Low anterior resection on 09/01/2011 with the pathology confirming a yPT1 N0 tumor.  Initiation of adjuvant Xeloda chemotherapy with cycle #1 beginning 10/07/2011, cycle #2 10/29/2011, cycle #3 on 11/24/2011, Cycle #4 on  12/10/2011, cycle 5 on 12/31/2011.  Laparoscopic-assisted ileostomy takedown on 02/10/2012  Restaging CTs of the chest, abdomen, and pelvis on 05/28/2012 revealed no evidence of metastatic disease.  Negative surveillance colonoscopy 11/05/2012  colonoscopy November 2016 -tubular adenoma removed from the right colon 2. History of multiple colorectal polyps. 3. Allergic rhinitis. 4. History of "chest heaviness and arm-leg pain" with a "hot" feeling during the course of chemotherapy and radiation, status post a negative cardiac evaluation by Dr. Percival Spanish.  5. erectile dysfunction following rectal surgery/radiation-improved with Cialis  6. Chronic rectal urgency/frequency following treatment for rectal urgency-improved following pelvic physical therapy 7. History of a left breast nodule-negative ultrasound by Dr. Lucia Gaskins 04/17/2014 8.  History of tobacco use-he undergoes yearly screening chest CTs    Disposition: [name] is in remission from rectal cancer.  He would like to continue follow-up at the Cancer center.  He will return for an office visit in July 2022.  Mr. Pretty will continue follow-up with Dr. Earlean Shawl for colonoscopy surveillance.  He will contact Dr. Earlean Shawl to schedule the next colonoscopy.  I encouraged him to obtain the COVID-19 vaccine.  Betsy Coder, MD  09/01/2020  11:30 AM

## 2020-09-01 NOTE — Telephone Encounter (Signed)
Scheduled appointment per 10/5 los. Spoke to patient who is aware of appointment date and time.

## 2020-09-18 ENCOUNTER — Telehealth: Payer: Self-pay | Admitting: *Deleted

## 2020-09-18 NOTE — Telephone Encounter (Signed)
Called to request Dr. Benay Spice order his low dose CT chest for this year due to his smoking history.

## 2020-09-30 ENCOUNTER — Other Ambulatory Visit: Payer: Self-pay | Admitting: Oncology

## 2020-09-30 DIAGNOSIS — C20 Malignant neoplasm of rectum: Secondary | ICD-10-CM

## 2020-11-06 ENCOUNTER — Ambulatory Visit (HOSPITAL_COMMUNITY)
Admission: RE | Admit: 2020-11-06 | Discharge: 2020-11-06 | Disposition: A | Discharge status: Home / Self Care | Payer: 59 | Source: Ambulatory Visit | Attending: Oncology | Admitting: Oncology

## 2020-11-06 ENCOUNTER — Other Ambulatory Visit: Payer: Self-pay

## 2020-11-06 DIAGNOSIS — C20 Malignant neoplasm of rectum: Secondary | ICD-10-CM | POA: Insufficient documentation

## 2020-11-12 ENCOUNTER — Telehealth: Payer: Self-pay

## 2020-11-12 NOTE — Telephone Encounter (Signed)
Call message left for CT scan results  Message: ct negative for cancer    Awaiting call back

## 2020-11-12 NOTE — Telephone Encounter (Signed)
-----   Message from [name], MD sent at 11/10/2020  4:52 PM EST ----- Please call patient, CT negative for cancer, f/u as scheduled

## 2021-01-13 ENCOUNTER — Telehealth: Payer: Self-pay | Admitting: Oncology

## 2021-01-13 NOTE — Telephone Encounter (Signed)
Rescheduled appointment per provider on call schedule. Spoke to patient who is aware of updated appointment date and time. Mailed updated calendar per patient request.

## 2021-03-25 ENCOUNTER — Encounter: Payer: Self-pay | Admitting: *Deleted

## 2021-06-01 ENCOUNTER — Ambulatory Visit: Payer: 59 | Admitting: Oncology

## 2021-06-03 ENCOUNTER — Inpatient Hospital Stay: Payer: 59 | Attending: Oncology | Admitting: Oncology

## 2021-06-03 ENCOUNTER — Other Ambulatory Visit: Payer: Self-pay

## 2021-06-03 VITALS — BP 154/84 | HR 84 | Temp 98.4°F | Resp 20 | Ht 71.0 in | Wt 214.4 lb

## 2021-06-03 DIAGNOSIS — C20 Malignant neoplasm of rectum: Secondary | ICD-10-CM

## 2021-06-03 DIAGNOSIS — Z85048 Personal history of other malignant neoplasm of rectum, rectosigmoid junction, and anus: Secondary | ICD-10-CM | POA: Insufficient documentation

## 2021-06-03 DIAGNOSIS — Z923 Personal history of irradiation: Secondary | ICD-10-CM | POA: Diagnosis not present

## 2021-06-03 DIAGNOSIS — N529 Male erectile dysfunction, unspecified: Secondary | ICD-10-CM | POA: Diagnosis not present

## 2021-06-03 DIAGNOSIS — Z87891 Personal history of nicotine dependence: Secondary | ICD-10-CM | POA: Insufficient documentation

## 2021-06-03 NOTE — Progress Notes (Signed)
  [place] OFFICE PROGRESS NOTE   Diagnosis: Rectal cancer  INTERVAL HISTORY:   Casey [name] returns as scheduled.  He feels well.  He continues to have irregular bowel habits.  He underwent a colonoscopy by Dr. Earlean [name] on 12/08/2020.  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 A screening chest CT in December 2021 had benign findings.  Objective:  Vital signs in last 24 hours:  Blood pressure (!) 154/84, pulse 84, temperature 98.4 F (36.9 C), temperature source Oral, resp. rate 20, height 5\' 11"  (1.803 m), weight 214 lb 6.4 oz (97.3 kg), SpO2 98 %.  Lymphatics: No cervical, supraclavicular, axillary, or inguinal nodes Resp: Lungs clear bilaterally Cardio: Regular rate and rhythm GI: No mass, no hepatosplenomegaly, nontender Vascular: No leg edema, left lower leg is slightly larger than the right side   Lab Results:  Lab Results  Component Value Date   CEA1 <1.00 07/06/2018    Medications: I have reviewed the patient's current medications.   Assessment/Plan:  Rectal cancer, clinical stage III (uT3, uN1), status post an endoscopic biopsy on 05/30/2011.   Initiation of concurrent Xeloda and radiation on 06/20/2011, completed on 07/27/2011. Low anterior resection on 09/01/2011 with the pathology confirming a yPT1 N0 tumor. Initiation of adjuvant Xeloda chemotherapy with cycle #1 beginning 10/07/2011, cycle #2 10/29/2011, cycle #3 on 11/24/2011, Cycle #4 on 12/10/2011, cycle 5 on 12/31/2011. Laparoscopic-assisted ileostomy takedown on 02/10/2012 Restaging CTs of the chest, abdomen, and pelvis on 05/28/2012 revealed no evidence of metastatic disease. Negative surveillance colonoscopy 11/05/2012 colonoscopy November 2016 -tubular adenoma removed from the right colon Colonoscopy 12/08/2020-mild radiation proctitis History of multiple colorectal polyps. Allergic rhinitis. 4. History of "chest heaviness and arm-leg pain" with a "hot" feeling during the  course of chemotherapy and radiation, status post a negative cardiac evaluation by Dr. Percival [name].   5. erectile dysfunction following rectal surgery/radiation-improved with Cialis   6. Chronic rectal urgency/frequency following treatment for rectal urgency- improved following pelvic physical therapy 7.  History of a left breast nodule-negative ultrasound by Dr. Lucia [name] 04/17/2014 8.  History of tobacco use-he undergoes yearly screening chest CTs     Disposition: Casey [name] remains in remission from rectal cancer.  He is now 10 years out from diagnosis.  He has a good prognosis for long-term disease-free survival.  He like to continue follow-up at the Cancer center.  He will return for an office visit in 1 year.  He will be scheduled for a low-dose screening chest CT in December 2022.  He will continue colonoscopy surveillance with Dr. Earlean [name] with the next colonoscopy planned at a 5-year interval.  Casey Coder, MD  06/03/2021  3:37 PM

## 2021-06-04 ENCOUNTER — Telehealth: Payer: Self-pay | Admitting: Oncology

## 2021-06-04 NOTE — Telephone Encounter (Signed)
Scheduled appt per 7/7 LOS - mailed letter with appt date and time

## 2021-11-08 ENCOUNTER — Other Ambulatory Visit: Payer: Self-pay | Admitting: *Deleted

## 2021-11-08 DIAGNOSIS — C20 Malignant neoplasm of rectum: Secondary | ICD-10-CM

## 2021-11-08 NOTE — Progress Notes (Signed)
Placed referral to [facility] for low dose CT chest screening.

## 2022-01-10 ENCOUNTER — Other Ambulatory Visit: Payer: Self-pay

## 2022-01-10 DIAGNOSIS — Z87891 Personal history of nicotine dependence: Secondary | ICD-10-CM

## 2022-01-17 ENCOUNTER — Other Ambulatory Visit: Payer: Self-pay

## 2022-01-17 ENCOUNTER — Ambulatory Visit (INDEPENDENT_AMBULATORY_CARE_PROVIDER_SITE_OTHER): Payer: 59

## 2022-01-17 ENCOUNTER — Ambulatory Visit (INDEPENDENT_AMBULATORY_CARE_PROVIDER_SITE_OTHER): Payer: 59 | Admitting: Acute Care

## 2022-01-17 ENCOUNTER — Encounter: Payer: Self-pay | Admitting: Acute Care

## 2022-01-17 DIAGNOSIS — Z87891 Personal history of nicotine dependence: Secondary | ICD-10-CM

## 2022-01-17 NOTE — Progress Notes (Signed)
Virtual Visit via Telephone Note  I connected with Casey [name] on 10/12/21 at  2:00 PM EST by telephone and verified that I am speaking with the correct person using two identifiers.  Location: Patient: Home Provider: Working from home   I discussed the limitations, risks, security and privacy concerns of performing an evaluation and management service by telephone and the availability of in person appointments. I also discussed with the patient that there may be a patient responsible charge related to this service. The patient expressed understanding and agreed to proceed.  Shared Decision Making Visit Lung Cancer Screening Program [phone])   Eligibility: Age 61 y.o. Pack Years Smoking History Calculation 21 (# packs/per year x # years smoked) Recent History of coughing up blood  no Unexplained weight loss? no ( >Than 15 pounds within the last 6 months ) Prior History Lung / other cancer no (Diagnosis within the last 5 years already requiring surveillance chest CT Scans). Smoking Status Former Smoker Former Smokers: Years since quit: 11 years  Quit Date: 2012  Visit Components: Discussion included one or more decision making aids. yes Discussion included risk/benefits of screening. yes Discussion included potential follow up diagnostic testing for abnormal scans. yes Discussion included meaning and risk of over diagnosis. yes Discussion included meaning and risk of False Positives. yes Discussion included meaning of total radiation exposure. yes  Counseling Included: Importance of adherence to annual lung cancer LDCT screening. yes Impact of comorbidities on ability to participate in the program. yes Ability and willingness to under diagnostic treatment. yes  Smoking Cessation Counseling: Current Smokers:  Discussed importance of smoking cessation. yes Information about tobacco cessation classes and interventions provided to patient. yes Patient provided with  "ticket" for LDCT Scan. yes Symptomatic Patient. no  Counseling NA Diagnosis Code: Tobacco Use Z72.0 Asymptomatic Patient no  Counseling NA Former Smokers:  Discussed the importance of maintaining cigarette abstinence. yes Diagnosis Code: Personal History of Nicotine Dependence. D92.426 Information about tobacco cessation classes and interventions provided to patient. Yes Patient provided with "ticket" for LDCT Scan. yes Written Order for Lung Cancer Screening with LDCT placed in Epic. Yes (CT Chest Lung Cancer Screening Low Dose W/O CM) STM1962 Z12.2-Screening of respiratory organs Z87.891-Personal history of nicotine dependence   I spent 25 minutes of face to face time with him discussing the risks and benefits of lung cancer screening. We viewed a power point together that explained in detail the above noted topics. We took the time to pause the power point at intervals to allow for questions to be asked and answered to ensure understanding. We discussed that he had taken the single most powerful action possible to decrease his risk of developing lung cancer when he quit smoking. I counseled him to remain smoke free, and to contact me if he ever had the desire to smoke again so that I can provide resources and tools to help support the effort to remain smoke free. We discussed the time and location of the scan, and that either  Doroteo Glassman RN or I will call with the results within  24-48 hours of receiving them. He has my card and contact information in the event he needs to speak with me, in addition to a copy of the power point we reviewed as a resource. He verbalized understanding of all of the above and had no further questions upon leaving the office.     I explained to the patient that there has been a high incidence of  coronary artery disease noted on these exams. I explained that this is a non-gated exam therefore degree or severity cannot be determined. This patient is on statin  therapy. I have asked the patient to follow-up with their PCP regarding any incidental finding of coronary artery disease and management with diet or medication as they feel is clinically indicated. The patient verbalized understanding of the above and had no further questions.    Jakiya Bookbinder D. Kenton Kingfisher, NP-C [facility] Personal contact information can be found on Amion  01/17/2022, 11:13 AM

## 2022-01-17 NOTE — Patient Instructions (Signed)
Thank you for participating in the [facility] Scre[facility]. °It was our pleasure to meet you today. °We will call you with the results of your scan within the next few days. °Your scan will be assigned a Lung RADS category score by the physicians reading the scans.  °This Lung RADS score determines follow up scanning.  °See below for description of categories, and follow up screening recommendations. °We will be in touch to schedule your follow up screening annually or based on recommendations of our providers. °We will fax a copy of your scan results to your Primary Care Physician, or the physician who referred you to the program, to ensure they have the results. °Please call the office if you have any questions or concerns regarding your scanning experience or results.  °Our office number is [phone]. °Please speak with Denise Phelps, RN. She is our Lung Cancer Screening RN. °If she is unavailable when you call, please have the office staff send her a message. She will return your call at her earliest convenience. °Remember, if your scan is normal, we will scan you annually as long as you continue to meet the criteria for the program. (Age 55-77, Current smoker or smoker who has quit within the last 15 years). °If you are a smoker, remember, quitting is the single most powerful action that you can take to decrease your risk of lung cancer and other pulmonary, breathing related problems. °We know quitting is hard, and we are here to help.  °Please let us know if there is anything we can do to help you meet your goal of quitting. °If you are a former smoker, congratulations. We are proud of you! Remain smoke free! °Remember you can refer friends or family members through the number above.  °We will screen them to make sure they meet criteria for the program. °Thank you for helping us take better care of you by participating in Lung Screening. ° °You can receive free nicotine replacement therapy  ( patches, gum or mints) by calling 1-800-QUIT NOW. Please call so we can get you on the path to becoming  a non-smoker. I know it is hard, but you can do this! ° °Lung RADS Categories: ° °Lung RADS 1: no nodules or definitely non-concerning nodules.  °Recommendation is for a repeat annual scan in 12 months. ° °Lung RADS 2:  nodules that are non-concerning in appearance and behavior with a very low likelihood of becoming an active cancer. °Recommendation is for a repeat annual scan in 12 months. ° °Lung RADS 3: nodules that are probably non-concerning , includes nodules with a low likelihood of becoming an active cancer.  Recommendation is for a 6-month repeat screening scan. Often noted after an upper respiratory illness. We will be in touch to make sure you have no questions, and to schedule your 6-month scan. ° °Lung RADS 4 A: nodules with concerning findings, recommendation is most often for a follow up scan in 3 months or additional testing based on our provider's assessment of the scan. We will be in touch to make sure you have no questions and to schedule the recommended 3 month follow up scan. ° °Lung RADS 4 B:  indicates findings that are concerning. We will be in touch with you to schedule additional diagnostic testing based on our provider's  assessment of the scan. ° °Hypnosis for smoking cessation  °[employer] °[phone] ° °Acupuncture for smoking cessation  °[facility] °[phone]  °

## 2022-01-20 ENCOUNTER — Other Ambulatory Visit: Payer: Self-pay

## 2022-01-20 DIAGNOSIS — Z87891 Personal history of nicotine dependence: Secondary | ICD-10-CM

## 2022-06-02 ENCOUNTER — Encounter: Payer: Self-pay | Admitting: Oncology

## 2022-06-02 ENCOUNTER — Inpatient Hospital Stay: Payer: 59 | Attending: Oncology | Admitting: Oncology

## 2022-06-02 VITALS — BP 143/79 | HR 84 | Temp 98.2°F | Resp 18 | Ht 71.0 in | Wt 218.2 lb

## 2022-06-02 DIAGNOSIS — Z85048 Personal history of other malignant neoplasm of rectum, rectosigmoid junction, and anus: Secondary | ICD-10-CM | POA: Diagnosis present

## 2022-06-02 DIAGNOSIS — Z87891 Personal history of nicotine dependence: Secondary | ICD-10-CM | POA: Insufficient documentation

## 2022-06-02 DIAGNOSIS — C20 Malignant neoplasm of rectum: Secondary | ICD-10-CM

## 2022-06-02 DIAGNOSIS — Z923 Personal history of irradiation: Secondary | ICD-10-CM | POA: Diagnosis not present

## 2022-06-02 NOTE — Progress Notes (Signed)
  [facility] OFFICE PROGRESS NOTE   Diagnosis: Rectal cancer  INTERVAL HISTORY:   Casey [name] returns as scheduled.  He feels well.  He continues to have irregular bowel habits.  He sometimes has up to 4-6 bowel movements per day.  He was recently placed on cholestyramine by his primary provider.  No bleeding.  He has noticed a raised lesion beneath the chin for the past several weeks.  Objective:  Vital signs in last 24 hours:  Blood pressure (!) 143/79, pulse 84, temperature 98.2 F (36.8 C), temperature source Oral, resp. rate 18, height '5\' 11"'$  (1.803 m), weight 218 lb 3.2 oz (99 kg), SpO2 98 %.    HEENT: Neck without mass Lymphatics: No cervical, supraclavicular, axillary, or inguinal nodes Resp: Lungs clear bilaterally Cardio: Regular rate and rhythm GI: No hepatosplenomegaly, no mass, nontender Vascular: No leg edema Skin: No appreciable mole or lesion in the area of concern within his beard beneath the chin  Lab Results:  Lab Results  Component Value Date   WBC 12.2 (H) 02/11/2012   HGB 13.3 02/11/2012   HCT 39.0 02/11/2012   MCV 99.2 02/11/2012   PLT 255 02/11/2012   NEUTROABS 3.8 02/07/2012    CMP  Lab Results  Component Value Date   NA 143 05/21/2012   K 3.9 05/21/2012   CL 105 05/21/2012   CO2 28 05/21/2012   GLUCOSE 109 (H) 05/21/2012   BUN 18 05/21/2012   CREATININE 1.18 05/21/2012   CALCIUM 9.0 05/21/2012   PROT 6.4 05/21/2012   ALBUMIN 4.1 05/21/2012   AST 14 05/21/2012   ALT 11 05/21/2012   ALKPHOS 50 05/21/2012   BILITOT 0.6 05/21/2012   GFRNONAA 77 (L) 02/11/2012   GFRAA 89 (L) 02/11/2012    Lab Results  Component Value Date   CEA1 <1.00 07/06/2018   CEA <0.5 06/03/2016    Medications: I have reviewed the patient's current medications.   Assessment/Plan: Rectal cancer, clinical stage III (uT3, uN1), status post an endoscopic biopsy on 05/30/2011.   Initiation of concurrent Xeloda and radiation on 06/20/2011,  completed on 07/27/2011. Low anterior resection on 09/01/2011 with the pathology confirming a yPT1 N0 tumor. Initiation of adjuvant Xeloda chemotherapy with cycle #1 beginning 10/07/2011, cycle #2 10/29/2011, cycle #3 on 11/24/2011, Cycle #4 on 12/10/2011, cycle 5 on 12/31/2011. Laparoscopic-assisted ileostomy takedown on 02/10/2012 Restaging CTs of the chest, abdomen, and pelvis on 05/28/2012 revealed no evidence of metastatic disease. Negative surveillance colonoscopy 11/05/2012 colonoscopy November 2016 -tubular adenoma removed from the right colon Colonoscopy 12/08/2020-mild radiation proctitis History of multiple colorectal polyps. Allergic rhinitis. 4. History of "chest heaviness and arm-leg pain" with a "hot" feeling during the course of chemotherapy and radiation, status post a negative cardiac evaluation by Dr. Percival Spanish.   5. erectile dysfunction following rectal surgery/radiation-improved with Cialis   6. Chronic rectal urgency/frequency following treatment for rectal urgency- improved following pelvic physical therapy 7.  History of a left breast nodule-negative ultrasound by Dr. Lucia Gaskins 04/17/2014 8.  History of tobacco use-he undergoes yearly screening chest CTs      Disposition: Casey [name] remains in clinical remission from rectal cancer.  He would like to continue follow-up at the Cancer center.  He will return for an office visit in 1 year.  He will continue colonoscopy surveillance.  Betsy Coder, MD  06/02/2022  3:39 PM

## 2023-01-17 ENCOUNTER — Ambulatory Visit (INDEPENDENT_AMBULATORY_CARE_PROVIDER_SITE_OTHER): Payer: 59

## 2023-01-17 DIAGNOSIS — Z87891 Personal history of nicotine dependence: Secondary | ICD-10-CM | POA: Diagnosis not present

## 2023-01-17 DIAGNOSIS — Z122 Encounter for screening for malignant neoplasm of respiratory organs: Secondary | ICD-10-CM | POA: Diagnosis not present

## 2023-01-19 ENCOUNTER — Other Ambulatory Visit: Payer: Self-pay

## 2023-01-19 DIAGNOSIS — Z122 Encounter for screening for malignant neoplasm of respiratory organs: Secondary | ICD-10-CM

## 2023-01-19 DIAGNOSIS — Z87891 Personal history of nicotine dependence: Secondary | ICD-10-CM

## 2023-06-05 ENCOUNTER — Inpatient Hospital Stay: Payer: 59 | Attending: Oncology | Admitting: Oncology

## 2023-06-05 VITALS — BP 139/80 | HR 85 | Temp 97.9°F | Resp 18 | Ht 71.0 in | Wt 212.3 lb

## 2023-06-05 DIAGNOSIS — J309 Allergic rhinitis, unspecified: Secondary | ICD-10-CM | POA: Insufficient documentation

## 2023-06-05 DIAGNOSIS — Z923 Personal history of irradiation: Secondary | ICD-10-CM | POA: Diagnosis not present

## 2023-06-05 DIAGNOSIS — C20 Malignant neoplasm of rectum: Secondary | ICD-10-CM | POA: Diagnosis present

## 2023-06-05 DIAGNOSIS — N529 Male erectile dysfunction, unspecified: Secondary | ICD-10-CM | POA: Diagnosis not present

## 2023-06-05 DIAGNOSIS — Z87891 Personal history of nicotine dependence: Secondary | ICD-10-CM | POA: Diagnosis not present

## 2023-06-05 NOTE — Progress Notes (Signed)
  [facility] OFFICE PROGRESS NOTE   Diagnosis: Rectal cancer  INTERVAL HISTORY:   Mr. Suite returns as scheduled.  He feels well.  He continues to have irregular bowel habits, but this has improved as compared to years ago.  He believes cholestyramine has helped.  No bleeding.  He recently had transient discomfort and fullness in the right breast.  This has resolved.  Objective:  Vital signs in last 24 hours:  Blood pressure 139/80, pulse 85, temperature 97.9 F (36.6 C), temperature source Oral, resp. rate 18, height 5\' 11"  (1.803 m), weight 212 lb 4.8 oz (96.3 kg), SpO2 96 %.   Lymphatics: No cervical, supraclavicular, axillary, or inguinal nodes Resp: Lungs clear bilaterally Cardio: Regular rate and rhythm GI: No hepatosplenomegaly, no mass, mild tenderness in the bilateral low abdomen Vascular: No leg edema Breast: Right breast without mass  Lab Results:  Lab Results  Component Value Date   WBC 12.2 (H) 02/11/2012   HGB 13.3 02/11/2012   HCT 39.0 02/11/2012   MCV 99.2 02/11/2012   PLT 255 02/11/2012   NEUTROABS 3.8 02/07/2012    CMP  Lab Results  Component Value Date   NA 143 05/21/2012   K 3.9 05/21/2012   CL 105 05/21/2012   CO2 28 05/21/2012   GLUCOSE 109 (H) 05/21/2012   BUN 18 05/21/2012   CREATININE 1.18 05/21/2012   CALCIUM 9.0 05/21/2012   PROT 6.4 05/21/2012   ALBUMIN 4.1 05/21/2012   AST 14 05/21/2012   ALT 11 05/21/2012   ALKPHOS 50 05/21/2012   BILITOT 0.6 05/21/2012   GFRNONAA 77 (L) 02/11/2012   GFRAA 89 (L) 02/11/2012    Lab Results  Component Value Date   CEA1 <1.00 07/06/2018   CEA <0.5 06/03/2016     Medications: I have reviewed the patient's current medications.   Assessment/Plan: Rectal cancer, clinical stage III (uT3, uN1), status post an endoscopic biopsy on 05/30/2011.   Initiation of concurrent Xeloda and radiation on 06/20/2011, completed on 07/27/2011. Low anterior resection on 09/01/2011 with the  pathology confirming a yPT1 N0 tumor. Initiation of adjuvant Xeloda chemotherapy with cycle #1 beginning 10/07/2011, cycle #2 10/29/2011, cycle #3 on 11/24/2011, Cycle #4 on 12/10/2011, cycle 5 on 12/31/2011. Laparoscopic-assisted ileostomy takedown on 02/10/2012 Restaging CTs of the chest, abdomen, and pelvis on 05/28/2012 revealed no evidence of metastatic disease. Negative surveillance colonoscopy 11/05/2012 colonoscopy November 2016 -tubular adenoma removed from the right colon Colonoscopy 12/08/2020-mild radiation proctitis History of multiple colorectal polyps. Allergic rhinitis. 4. History of "chest heaviness and arm-leg pain" with a "hot" feeling during the course of chemotherapy and radiation, status post a negative cardiac evaluation by [name].   5. erectile dysfunction following rectal surgery/radiation-improved with Cialis   6. Chronic rectal urgency/frequency following treatment for rectal urgency- improved following pelvic physical therapy 7.  History of a left breast nodule-negative ultrasound by Dr. Ezzard Standing 04/17/2014 8.  History of tobacco use-he undergoes yearly screening chest CTs     Disposition: Casey [name] is in clinical remission from rectal cancer.  He would like continue follow-up at the cancer center.  He will return for an office visit in 1 year.  He last underwent a colonoscopy in January 2022 and will plan for a repeat colonoscopy at a 5-year interval. He continues lung cancer screening with the pulmonary medicine program.  Thornton Papas, MD  06/05/2023  3:10 PM

## 2023-06-28 IMAGING — CT CT CHEST LUNG CANCER SCREENING LOW DOSE W/O CM
1 series · 10 of 10 positions shown, 13 images · non-contrast
Comparison: Low-dose lung cancer screening chest CT 11/06/2020.

CLINICAL DATA: 60-year-old male former smoker (quit in 8038) with
21 pack-year history of smoking. Lung cancer screening examination.



[ct lung segmentation data · axial · 0.73mm/px · z∈[-334,-334]mm · 10 of 321 frames shown]
[frame 1/321  mediastinal]
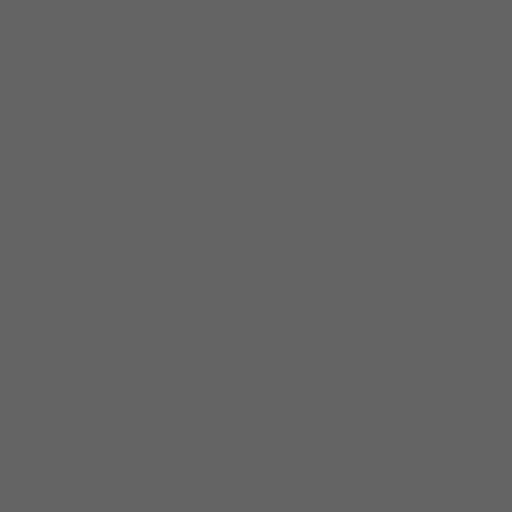
[frame 1/321  lung]
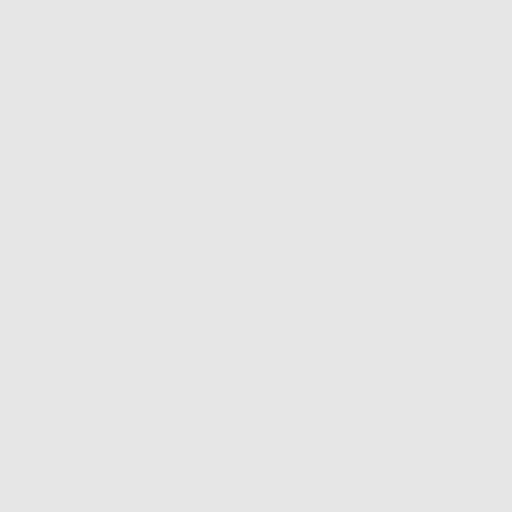
[frame 36/321  lung]
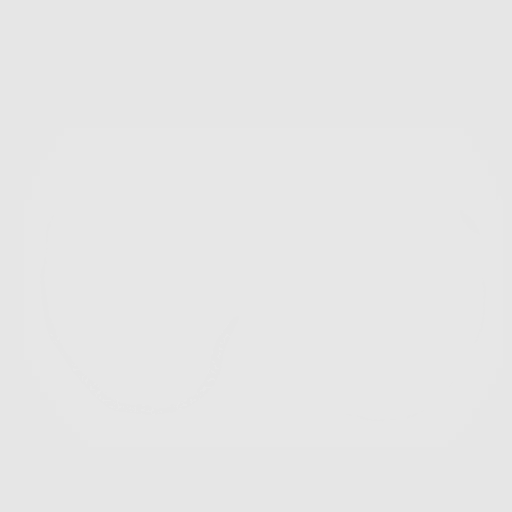
[frame 72/321  lung]
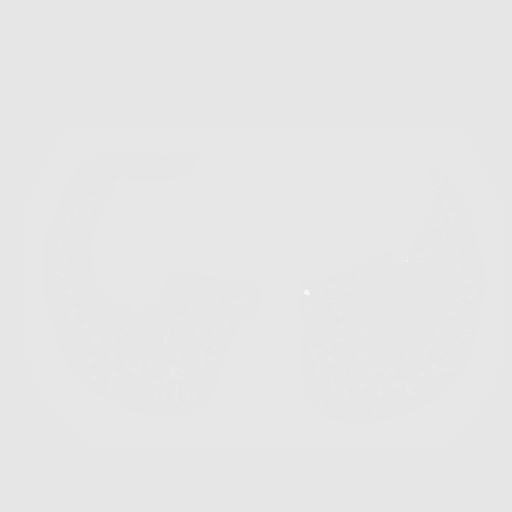
[frame 107/321  lung]
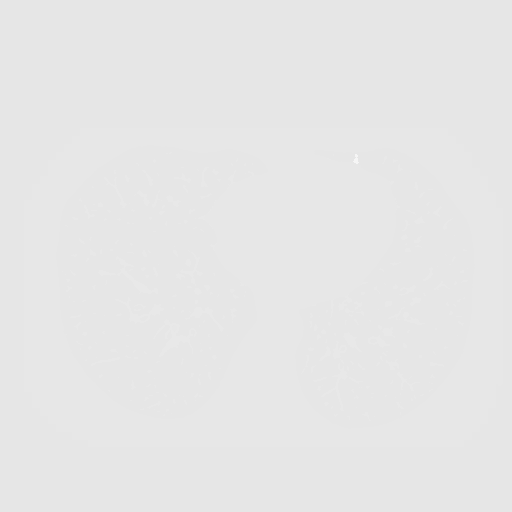
[frame 143/321  mediastinal]
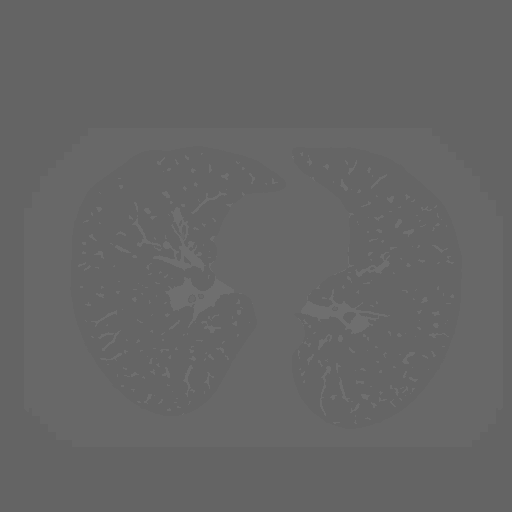
[frame 143/321  lung]
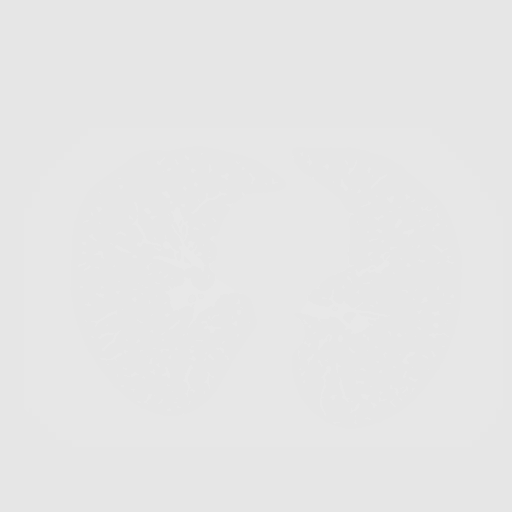
[frame 178/321  lung]
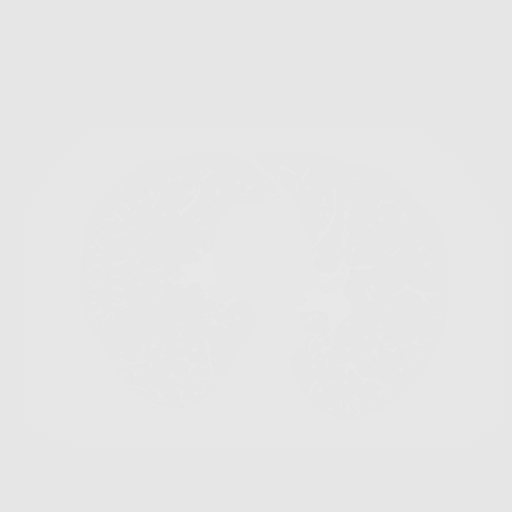
[frame 214/321  lung]
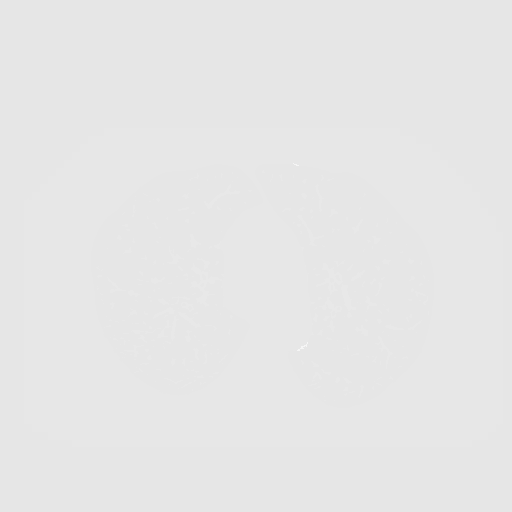
[frame 249/321  lung]
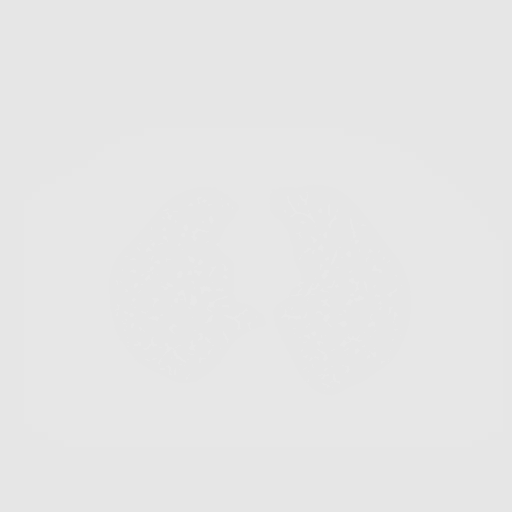
[frame 285/321  mediastinal]
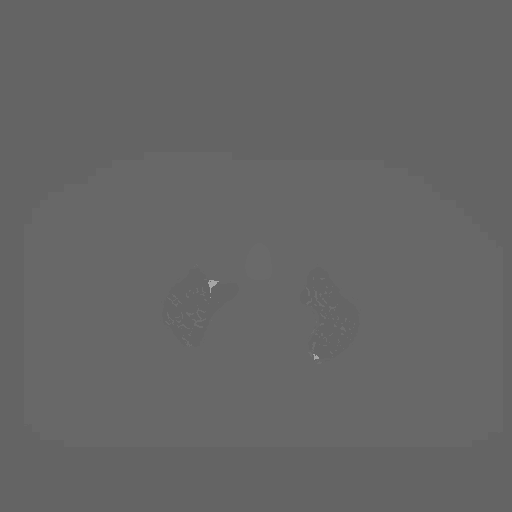
[frame 285/321  lung]
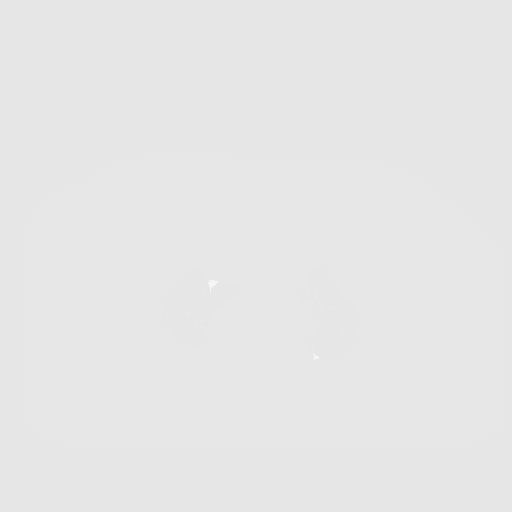
[frame 321/321  lung]
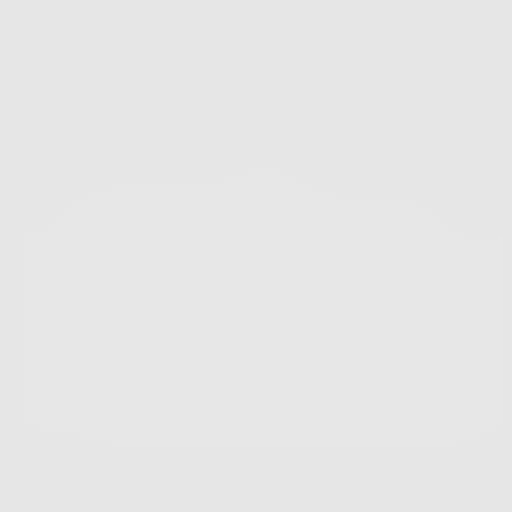

[10 of 10 positions shown; findings below may reference images not displayed]

FINDINGS: Cardiovascular: Heart size is normal. There is no significant
pericardial fluid, thickening or pericardial calcification. There is
aortic atherosclerosis, as well as atherosclerosis of the great
vessels of the mediastinum and the coronary arteries, including
calcified atherosclerotic plaque in the left main, left anterior
descending, left circumflex and right coronary arteries.

Mediastinum/Nodes: No pathologically enlarged mediastinal or hilar
lymph nodes. Please note that accurate exclusion of hilar adenopathy
is limited on noncontrast CT scans. Esophagus is unremarkable in
appearance. No axillary lymphadenopathy.

Lungs/Pleura: Tiny pulmonary nodules are again noted, largest of
which is in the inferolateral aspect of the right upper lobe (axial
image 150 of series 3), with a volume derived mean diameter of only
2.8 mm. No other larger more suspicious appearing pulmonary nodules
or masses are noted. No acute consolidative airspace disease. No
pleural effusions. Mild diffuse bronchial wall thickening with mild
centrilobular and paraseptal emphysema.

Upper Abdomen: Unremarkable.

Musculoskeletal: There are no aggressive appearing lytic or blastic
lesions noted in the visualized portions of the skeleton.
IMPRESSION: 1. Lung-RADS 2S, benign appearance or behavior. Continue annual
screening with low-dose chest CT without contrast in 12 months.
2. The "S" modifier above refers to potentially clinically
significant non lung cancer related findings. Specifically, there is
aortic atherosclerosis, in addition to left main and three-vessel
coronary artery disease. Please note that although the presence of
coronary artery calcium documents the presence of coronary artery
disease, the severity of this disease and any potential stenosis
cannot be assessed on this non-gated CT examination. Assessment for
potential risk factor modification, dietary therapy or pharmacologic
therapy may be warranted, if clinically indicated.
3. Mild diffuse bronchial wall thickening with mild centrilobular
and paraseptal emphysema; imaging findings suggestive of underlying
COPD.

Aortic Atherosclerosis (J9D9H-OJ2.2) and Emphysema (J9D9H-0TH.D).

## 2024-01-18 ENCOUNTER — Ambulatory Visit: Payer: 59

## 2024-01-18 DIAGNOSIS — Z122 Encounter for screening for malignant neoplasm of respiratory organs: Secondary | ICD-10-CM

## 2024-01-18 DIAGNOSIS — Z87891 Personal history of nicotine dependence: Secondary | ICD-10-CM | POA: Diagnosis not present

## 2024-01-19 ENCOUNTER — Ambulatory Visit: Payer: 59

## 2024-02-07 DIAGNOSIS — N182 Chronic kidney disease, stage 2 (mild): Secondary | ICD-10-CM | POA: Diagnosis not present

## 2024-02-07 DIAGNOSIS — Z125 Encounter for screening for malignant neoplasm of prostate: Secondary | ICD-10-CM | POA: Diagnosis not present

## 2024-02-07 DIAGNOSIS — K909 Intestinal malabsorption, unspecified: Secondary | ICD-10-CM | POA: Diagnosis not present

## 2024-02-07 DIAGNOSIS — I10 Essential (primary) hypertension: Secondary | ICD-10-CM | POA: Diagnosis not present

## 2024-02-07 DIAGNOSIS — R7303 Prediabetes: Secondary | ICD-10-CM | POA: Diagnosis not present

## 2024-02-07 DIAGNOSIS — E782 Mixed hyperlipidemia: Secondary | ICD-10-CM | POA: Diagnosis not present

## 2024-02-08 ENCOUNTER — Other Ambulatory Visit: Payer: Self-pay

## 2024-02-08 DIAGNOSIS — Z87891 Personal history of nicotine dependence: Secondary | ICD-10-CM

## 2024-02-08 DIAGNOSIS — Z122 Encounter for screening for malignant neoplasm of respiratory organs: Secondary | ICD-10-CM

## 2024-02-14 DIAGNOSIS — R7303 Prediabetes: Secondary | ICD-10-CM | POA: Diagnosis not present

## 2024-02-14 DIAGNOSIS — E782 Mixed hyperlipidemia: Secondary | ICD-10-CM | POA: Diagnosis not present

## 2024-02-14 DIAGNOSIS — I10 Essential (primary) hypertension: Secondary | ICD-10-CM | POA: Diagnosis not present

## 2024-02-14 DIAGNOSIS — N182 Chronic kidney disease, stage 2 (mild): Secondary | ICD-10-CM | POA: Diagnosis not present

## 2024-02-14 DIAGNOSIS — K909 Intestinal malabsorption, unspecified: Secondary | ICD-10-CM | POA: Diagnosis not present

## 2024-06-03 DIAGNOSIS — Z460 Encounter for fitting and adjustment of spectacles and contact lenses: Secondary | ICD-10-CM | POA: Diagnosis not present

## 2024-06-03 DIAGNOSIS — H524 Presbyopia: Secondary | ICD-10-CM | POA: Diagnosis not present

## 2024-06-03 DIAGNOSIS — H4423 Degenerative myopia, bilateral: Secondary | ICD-10-CM | POA: Diagnosis not present

## 2024-06-03 DIAGNOSIS — H52223 Regular astigmatism, bilateral: Secondary | ICD-10-CM | POA: Diagnosis not present

## 2024-06-03 DIAGNOSIS — H2513 Age-related nuclear cataract, bilateral: Secondary | ICD-10-CM | POA: Diagnosis not present

## 2024-06-06 ENCOUNTER — Inpatient Hospital Stay: Payer: 59 | Attending: Oncology | Admitting: Oncology

## 2024-06-06 VITALS — BP 133/78 | HR 75 | Temp 98.1°F | Resp 18 | Ht 71.0 in | Wt 207.8 lb

## 2024-06-06 DIAGNOSIS — C20 Malignant neoplasm of rectum: Secondary | ICD-10-CM

## 2024-06-06 DIAGNOSIS — Z85048 Personal history of other malignant neoplasm of rectum, rectosigmoid junction, and anus: Secondary | ICD-10-CM | POA: Diagnosis not present

## 2024-06-06 DIAGNOSIS — Z87891 Personal history of nicotine dependence: Secondary | ICD-10-CM | POA: Diagnosis not present

## 2024-06-06 DIAGNOSIS — Z923 Personal history of irradiation: Secondary | ICD-10-CM | POA: Insufficient documentation

## 2024-06-06 NOTE — Progress Notes (Signed)
  [facility] OFFICE PROGRESS NOTE   Diagnosis: Rectal cancer  INTERVAL HISTORY:   Casey Hardacre returns as scheduled.  He generally feels well.  He is caring for his ill wife.  He continues to have irregular bowel habits, but this has improved.  No bleeding.  He has occasional discomfort at the upper abdomen and lower chest after lifting his wife.  Objective:  Vital signs in last 24 hours:  Blood pressure 133/78, pulse 75, temperature 98.1 F (36.7 C), temperature source Temporal, resp. rate 18, height 5' 11 (1.803 m), weight 207 lb 12.8 oz (94.3 kg), SpO2 98%.   Lymphatics: No cervical, supraclavicular, axillary, or inguinal nodes Resp: Clear bilaterally Cardio: Regular rate and rhythm GI: No mass, nontender, no hepatosplenomegaly Vascular: No leg edema   Lab Results:  Lab Results  Component Value Date   WBC 12.2 (H) 02/11/2012   HGB 13.3 02/11/2012   HCT 39.0 02/11/2012   MCV 99.2 02/11/2012   PLT 255 02/11/2012   NEUTROABS 3.8 02/07/2012    CMP  Lab Results  Component Value Date   NA 143 05/21/2012   K 3.9 05/21/2012   CL 105 05/21/2012   CO2 28 05/21/2012   GLUCOSE 109 (H) 05/21/2012   BUN 18 05/21/2012   CREATININE 1.18 05/21/2012   CALCIUM 9.0 05/21/2012   PROT 6.4 05/21/2012   ALBUMIN 4.1 05/21/2012   AST 14 05/21/2012   ALT 11 05/21/2012   ALKPHOS 50 05/21/2012   BILITOT 0.6 05/21/2012   GFRNONAA 77 (L) 02/11/2012   GFRAA 89 (L) 02/11/2012    Lab Results  Component Value Date   CEA1 <1.00 07/06/2018   CEA <0.5 06/03/2016    Medications: I have reviewed the patient's current medications.   Assessment/Plan: Rectal cancer, clinical stage III (uT3, uN1), status post an endoscopic biopsy on 05/30/2011.   Initiation of concurrent Xeloda  and radiation on 06/20/2011, completed on 07/27/2011. Low anterior resection on 09/01/2011 with the pathology confirming a yPT1 N0 tumor. Initiation of adjuvant Xeloda  chemotherapy with cycle #1  beginning 10/07/2011, cycle #2 10/29/2011, cycle #3 on 11/24/2011, Cycle #4 on 12/10/2011, cycle 5 on 12/31/2011. Laparoscopic-assisted ileostomy takedown on 02/10/2012 Restaging CTs of the chest, abdomen, and pelvis on 05/28/2012 revealed no evidence of metastatic disease. Negative surveillance colonoscopy 11/05/2012 colonoscopy November 2016 -tubular adenoma removed from the right colon Colonoscopy 12/08/2020-mild radiation proctitis History of multiple colorectal polyps. Allergic rhinitis. 4. History of chest heaviness and arm-leg pain with a hot feeling during the course of chemotherapy and radiation, status post a negative cardiac evaluation by Dr. Lavona.   5. erectile dysfunction following rectal surgery/radiation-improved with Cialis   6. Chronic rectal urgency/frequency following treatment for rectal urgency- improved following pelvic physical therapy 7.  History of a left breast nodule-negative ultrasound by Dr. Ethyl 04/17/2014 8.  History of tobacco use-he undergoes yearly screening chest CTs     Disposition: Casey [name] remains in remission from rectal cancer.  He would like continued follow-up in the oncology clinic.  He will return for an office visit in 1 year.  He will continue colonoscopy surveillance.  Arley Hof, MD  06/06/2024  3:31 PM

## 2024-07-30 DIAGNOSIS — R7303 Prediabetes: Secondary | ICD-10-CM | POA: Diagnosis not present

## 2024-07-30 DIAGNOSIS — E782 Mixed hyperlipidemia: Secondary | ICD-10-CM | POA: Diagnosis not present

## 2024-07-30 DIAGNOSIS — I10 Essential (primary) hypertension: Secondary | ICD-10-CM | POA: Diagnosis not present

## 2024-07-30 DIAGNOSIS — N182 Chronic kidney disease, stage 2 (mild): Secondary | ICD-10-CM | POA: Diagnosis not present

## 2024-07-30 DIAGNOSIS — K909 Intestinal malabsorption, unspecified: Secondary | ICD-10-CM | POA: Diagnosis not present

## 2024-07-30 DIAGNOSIS — R5383 Other fatigue: Secondary | ICD-10-CM | POA: Diagnosis not present

## 2024-07-30 DIAGNOSIS — Z125 Encounter for screening for malignant neoplasm of prostate: Secondary | ICD-10-CM | POA: Diagnosis not present

## 2024-08-06 DIAGNOSIS — K909 Intestinal malabsorption, unspecified: Secondary | ICD-10-CM | POA: Diagnosis not present

## 2024-08-06 DIAGNOSIS — N182 Chronic kidney disease, stage 2 (mild): Secondary | ICD-10-CM | POA: Diagnosis not present

## 2024-08-06 DIAGNOSIS — Z0000 Encounter for general adult medical examination without abnormal findings: Secondary | ICD-10-CM | POA: Diagnosis not present

## 2024-08-06 DIAGNOSIS — R7303 Prediabetes: Secondary | ICD-10-CM | POA: Diagnosis not present

## 2024-08-06 DIAGNOSIS — I10 Essential (primary) hypertension: Secondary | ICD-10-CM | POA: Diagnosis not present

## 2024-08-06 DIAGNOSIS — Z23 Encounter for immunization: Secondary | ICD-10-CM | POA: Diagnosis not present

## 2024-08-06 DIAGNOSIS — E782 Mixed hyperlipidemia: Secondary | ICD-10-CM | POA: Diagnosis not present

## 2025-02-03 ENCOUNTER — Ambulatory Visit

## 2025-06-05 ENCOUNTER — Ambulatory Visit: Admitting: Oncology
